# Patient Record
Sex: Female | Born: 1940 | Race: White | Hispanic: No | Marital: Single | Smoking: Never smoker
Health system: Southern US, Community
[De-identification: ages and names within clinical notes are randomized; demographics above are authoritative.]

## PROBLEM LIST (undated history)

## (undated) DIAGNOSIS — I629 Nontraumatic intracranial hemorrhage, unspecified: Secondary | ICD-10-CM

## (undated) DIAGNOSIS — B019 Varicella without complication: Secondary | ICD-10-CM

## (undated) DIAGNOSIS — H9190 Unspecified hearing loss, unspecified ear: Secondary | ICD-10-CM

## (undated) DIAGNOSIS — I10 Essential (primary) hypertension: Secondary | ICD-10-CM

## (undated) DIAGNOSIS — J45909 Unspecified asthma, uncomplicated: Secondary | ICD-10-CM

## (undated) DIAGNOSIS — G8929 Other chronic pain: Secondary | ICD-10-CM

## (undated) DIAGNOSIS — N390 Urinary tract infection, site not specified: Secondary | ICD-10-CM

## (undated) DIAGNOSIS — Z87828 Personal history of other (healed) physical injury and trauma: Secondary | ICD-10-CM

## (undated) DIAGNOSIS — I2510 Atherosclerotic heart disease of native coronary artery without angina pectoris: Secondary | ICD-10-CM

## (undated) DIAGNOSIS — H409 Unspecified glaucoma: Secondary | ICD-10-CM

## (undated) DIAGNOSIS — R32 Unspecified urinary incontinence: Secondary | ICD-10-CM

## (undated) DIAGNOSIS — K635 Polyp of colon: Secondary | ICD-10-CM

## (undated) DIAGNOSIS — E039 Hypothyroidism, unspecified: Secondary | ICD-10-CM

## (undated) DIAGNOSIS — I252 Old myocardial infarction: Secondary | ICD-10-CM

## (undated) DIAGNOSIS — E785 Hyperlipidemia, unspecified: Secondary | ICD-10-CM

## (undated) DIAGNOSIS — T07XXXA Unspecified multiple injuries, initial encounter: Secondary | ICD-10-CM

## (undated) HISTORY — PX: CORNEAL TRANSPLANT: SHX108

## (undated) HISTORY — DX: Other chronic pain: G89.29

## (undated) HISTORY — DX: Hyperlipidemia, unspecified: E78.5

## (undated) HISTORY — DX: Unspecified glaucoma: H40.9

## (undated) HISTORY — DX: Unspecified multiple injuries, initial encounter: T07.XXXA

## (undated) HISTORY — DX: Polyp of colon: K63.5

## (undated) HISTORY — PX: TONSILLECTOMY: SUR1361

## (undated) HISTORY — PX: BLADDER REPAIR: SHX76

## (undated) HISTORY — DX: Nontraumatic intracranial hemorrhage, unspecified: I62.9

## (undated) HISTORY — DX: Essential (primary) hypertension: I10

## (undated) HISTORY — DX: Unspecified hearing loss, unspecified ear: H91.90

## (undated) HISTORY — DX: Urinary tract infection, site not specified: N39.0

## (undated) HISTORY — PX: RIGHT AND LEFT HEART CATH: CATH118262

## (undated) HISTORY — DX: Unspecified asthma, uncomplicated: J45.909

## (undated) HISTORY — PX: CRANIOTOMY: SHX93

## (undated) HISTORY — DX: Varicella without complication: B01.9

## (undated) HISTORY — PX: ABDOMINAL HYSTERECTOMY: SHX81

## (undated) HISTORY — DX: Hypothyroidism, unspecified: E03.9

## (undated) HISTORY — PX: OTHER SURGICAL HISTORY: SHX169

## (undated) HISTORY — DX: Unspecified urinary incontinence: R32

---
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
## 2015-11-25 HISTORY — PX: TRANSTHORACIC ECHOCARDIOGRAM: SHX275

## 2016-03-06 DIAGNOSIS — H18519 Endothelial corneal dystrophy, unspecified eye: Secondary | ICD-10-CM | POA: Insufficient documentation

## 2016-03-26 DIAGNOSIS — Z87828 Personal history of other (healed) physical injury and trauma: Secondary | ICD-10-CM

## 2016-03-26 HISTORY — DX: Personal history of other (healed) physical injury and trauma: Z87.828

## 2016-07-24 DIAGNOSIS — I2510 Atherosclerotic heart disease of native coronary artery without angina pectoris: Secondary | ICD-10-CM | POA: Insufficient documentation

## 2016-07-24 HISTORY — DX: Atherosclerotic heart disease of native coronary artery without angina pectoris: I25.10

## 2016-07-24 HISTORY — PX: RIGHT AND LEFT HEART CATH: CATH118262

## 2016-07-31 DIAGNOSIS — I2510 Atherosclerotic heart disease of native coronary artery without angina pectoris: Secondary | ICD-10-CM | POA: Insufficient documentation

## 2016-07-31 DIAGNOSIS — I252 Old myocardial infarction: Secondary | ICD-10-CM

## 2016-07-31 HISTORY — DX: Atherosclerotic heart disease of native coronary artery without angina pectoris: I25.10

## 2016-07-31 HISTORY — DX: Old myocardial infarction: I25.2

## 2017-09-19 ENCOUNTER — Other Ambulatory Visit: Payer: Self-pay

## 2017-09-19 ENCOUNTER — Ambulatory Visit
Admission: EM | Admit: 2017-09-19 | Discharge: 2017-09-19 | Disposition: A | Discharge status: Home / Self Care | Payer: Medicare Other | Attending: Family Medicine | Admitting: Family Medicine

## 2017-09-19 ENCOUNTER — Ambulatory Visit
Admit: 2017-09-19 | Discharge: 2017-09-19 | Disposition: A | Discharge status: Home / Self Care | Payer: Medicare Other | Attending: Family Medicine | Admitting: Family Medicine

## 2017-09-19 ENCOUNTER — Ambulatory Visit: Payer: Medicare Other

## 2017-09-19 DIAGNOSIS — M7989 Other specified soft tissue disorders: Secondary | ICD-10-CM | POA: Diagnosis not present

## 2017-09-19 DIAGNOSIS — M25562 Pain in left knee: Secondary | ICD-10-CM | POA: Diagnosis present

## 2017-09-19 DIAGNOSIS — I10 Essential (primary) hypertension: Secondary | ICD-10-CM | POA: Insufficient documentation

## 2017-09-19 DIAGNOSIS — Z7982 Long term (current) use of aspirin: Secondary | ICD-10-CM | POA: Diagnosis not present

## 2017-09-19 DIAGNOSIS — M7122 Synovial cyst of popliteal space [Baker], left knee: Secondary | ICD-10-CM | POA: Diagnosis not present

## 2017-09-19 DIAGNOSIS — Z79899 Other long term (current) drug therapy: Secondary | ICD-10-CM | POA: Diagnosis not present

## 2017-09-19 DIAGNOSIS — Z79890 Hormone replacement therapy (postmenopausal): Secondary | ICD-10-CM | POA: Insufficient documentation

## 2017-09-19 DIAGNOSIS — Z823 Family history of stroke: Secondary | ICD-10-CM | POA: Diagnosis not present

## 2017-09-19 DIAGNOSIS — E785 Hyperlipidemia, unspecified: Secondary | ICD-10-CM | POA: Insufficient documentation

## 2017-09-19 MED ORDER — TRAMADOL HCL 50 MG PO TABS: 50.0000 mg | ORAL_TABLET | Freq: Three times a day (TID) | ORAL | 0 refills | Status: DC | PRN

## 2017-09-19 NOTE — Discharge Instructions (Signed)
Medication as directed.  We will call with the [place] results.  Take care  Dr. Lacinda Axon

## 2017-09-19 NOTE — ED Provider Notes (Signed)
[facility]  CSN: [id number] Arrival date & time: 09/19/17  1501  History   Chief Complaint Chief Complaint  Patient presents with  . Knee Pain    left   HPI  77 year old female presents with left knee pain as well as left lower extremity swelling.  Patient reports a one-month history of left knee pain.  She states that it worsened after starting a new exercise routine.  Worse with activity.  Moderate in severity.  No relieving factors.  Additionally, patient reports ankle and calf swelling as of yesterday.  She just traveled to [facility] via train on a 13 to 14-hour ride.  Is concerned for DVT.  No other associated symptoms.  No other claims concerns this time.  PMH - HTN, HLD  Past Surgical History:  Procedure Laterality Date  . ABDOMINAL HYSTERECTOMY    . BLADDER REPAIR    . CESAREAN SECTION    . RIGHT AND LEFT HEART CATH    . TONSILLECTOMY     OB History   None    Home Medications    Prior to Admission medications   Medication Sig Start Date End Date Taking? Authorizing Provider  aspirin 81 MG chewable tablet Chew by mouth daily.   Yes [provider]  Calcium Carb-Cholecalciferol (CALCIUM 1000 + D PO) Take by mouth.   Yes [provider]  carvedilol (COREG) 12.5 MG tablet Take 12.5 mg by mouth 2 (two) times daily with a meal.   Yes [provider]  ESTROGENS CONJ SYNTHETIC B PO Take by mouth.   Yes [provider]  levothyroxine (SYNTHROID, LEVOTHROID) 88 MCG tablet Take 88 mcg by mouth daily before breakfast.   Yes [provider]  losartan (COZAAR) 50 MG tablet Take 50 mg by mouth daily.   Yes [provider]  rosuvastatin (CRESTOR) 20 MG tablet Take 20 mg by mouth daily.   Yes [provider]  traMADol (ULTRAM) 50 MG tablet Take 1 tablet (50 mg total) by mouth every 8 (eight) hours as needed. 09/19/17   Coral Spikes, DO    Family History Family History  Problem Relation Age of Onset    . Cancer Father   . CVA Father     Social History Social History   Tobacco Use  . Smoking status: Never Smoker  . Smokeless tobacco: Never Used  Substance Use Topics  . Alcohol use: Never    Frequency: Never  . Drug use: Never     Allergies   Patient has no known allergies.   Review of Systems Review of Systems  Cardiovascular: Positive for leg swelling.  Musculoskeletal:       Left knee pain.   Physical Exam Triage Vital Signs ED Triage Vitals  Enc Vitals Group     BP 09/19/17 1524 (!) 161/77     Pulse Rate 09/19/17 1524 69     Resp 09/19/17 1524 18     Temp 09/19/17 1524 97.7 F (36.5 C)     Temp Source 09/19/17 1524 Oral     SpO2 09/19/17 1524 98 %     Weight 09/19/17 1520 145 lb (65.8 kg)     Height 09/19/17 1520 5' (1.524 m)     Head Circumference --      Peak Flow --      Pain Score 09/19/17 1520 7     Pain Loc --      Pain Edu? --      Excl. in  GC? --    Updated Vital Signs BP (!) 161/77 (BP Location: Left Arm)   Pulse 69   Temp 97.7 F (36.5 C) (Oral)   Resp 18   Ht 5' (1.524 m)   Wt 145 lb (65.8 kg)   SpO2 98%   BMI 28.32 kg/m   Visual Acuity Right Eye Distance:   Left Eye Distance:   Bilateral Distance:    Right Eye Near:   Left Eye Near:    Bilateral Near:     Physical Exam  Constitutional: She is oriented to person, place, and time. She appears well-developed. No distress.  HENT:  Head: Normocephalic and atraumatic.  Cardiovascular: Normal rate and regular rhythm.  Left ankle and calf with 1+ edema.  Pulmonary/Chest: Effort normal and breath sounds normal. She has no wheezes. She has no rales.  Musculoskeletal:  Left knee -no apparent effusion.  Normal range of motion.  Ligaments intact.  No discrete areas of tenderness.    Neurological: She is alert and oriented to person, place, and time.  Psychiatric: She has a normal mood and affect. Her behavior is normal.  Nursing note and vitals reviewed.  UC Treatments / Results   Labs (all labs ordered are listed, but only abnormal results are displayed) Labs Reviewed - No data to display  EKG None  Radiology Dg Knee Complete 4 Views Left  Result Date: 09/19/2017 CLINICAL DATA:  Bilateral anterior knee pain adjacent to the patella for 1 month. No trauma. EXAM: LEFT KNEE - COMPLETE 4+ VIEW COMPARISON:  None. FINDINGS: Mild seen laterally. Mild degenerative changes in the medial compartment. Enthesopathic changes at the superior patella. No significant joint effusion. No fracture noted. Increased attenuation Hoffa's fat. The patellar tendon demonstrates a mildly irregular contour. No bony lesions. No other acute abnormalities. IMPRESSION: Increased attenuation within Hoffa's fat and an indistinct anterior border of the patellar tendon could represent sequela of inflammation. Recommend clinical correlation. Enthesopathic changes at the superior patella. Electronically Signed   By: Dorise Bullion III M.D   On: 09/19/2017 16:06    Procedures Procedures (including critical care time)  Medications Ordered in UC Medications - No data to display  Initial Impression / Assessment and Plan / UC Course  I have reviewed the triage vital signs and the nursing notes.  Pertinent labs & imaging results that were available during my care of the patient were reviewed by me and considered in my medical decision making (see chart for details).    77 year old female presents with left knee pain and left leg swelling.  X-ray obtained and revealed possible inflammation around the patellar tendon.  Treating with tramadol.  Patient had a recent long train ride, concern for DVT.  Sending for ultrasound.  Final Clinical Impressions(s) / UC Diagnoses   Final diagnoses:  Acute pain of left knee  Left leg swelling     Discharge Instructions     Medication as directed.  We will call with the [place] results.  Take care  Dr. Lacinda Axon     ED Prescriptions    Medication Sig Dispense  Auth. Provider   traMADol (ULTRAM) 50 MG tablet Take 1 tablet (50 mg total) by mouth every 8 (eight) hours as needed. 15 tablet Coral Spikes, DO     Controlled Substance Prescriptions [place] Controlled Substance Registry consulted? Not Applicable   Coral Spikes, DO 09/19/17 1712

## 2017-09-19 NOTE — ED Triage Notes (Signed)
Patient complains of left knee pain that started weeks ago. Patient states that the pain worsened after a recent train trip x 14 hours. Patient states that the knee pain has been worsening and now has calf and ankle swelling.

## 2018-11-10 ENCOUNTER — Telehealth: Payer: Self-pay

## 2018-11-10 NOTE — Telephone Encounter (Signed)
NOTES ON FILE FROM DR Karena Addison [phone], SENT REFERRAL TO [place]

## 2018-12-31 ENCOUNTER — Encounter: Payer: Self-pay | Admitting: Cardiology

## 2018-12-31 ENCOUNTER — Other Ambulatory Visit: Payer: Self-pay

## 2018-12-31 ENCOUNTER — Ambulatory Visit (INDEPENDENT_AMBULATORY_CARE_PROVIDER_SITE_OTHER): Payer: Medicare Other | Admitting: Cardiology

## 2018-12-31 VITALS — BP 162/80 | HR 67 | Ht 60.0 in | Wt 145.0 lb

## 2018-12-31 DIAGNOSIS — E785 Hyperlipidemia, unspecified: Secondary | ICD-10-CM | POA: Diagnosis not present

## 2018-12-31 DIAGNOSIS — I10 Essential (primary) hypertension: Secondary | ICD-10-CM | POA: Diagnosis not present

## 2018-12-31 DIAGNOSIS — I2510 Atherosclerotic heart disease of native coronary artery without angina pectoris: Secondary | ICD-10-CM | POA: Diagnosis not present

## 2018-12-31 MED ORDER — LOSARTAN POTASSIUM 50 MG PO TABS: 50.0000 mg | ORAL_TABLET | Freq: Two times a day (BID) | ORAL | 3 refills | Status: DC

## 2018-12-31 MED ORDER — CARVEDILOL 12.5 MG PO TABS: 12.5000 mg | ORAL_TABLET | Freq: Two times a day (BID) | ORAL | 3 refills | Status: DC

## 2018-12-31 MED ORDER — ROSUVASTATIN CALCIUM 20 MG PO TABS: 20.0000 mg | ORAL_TABLET | Freq: Every day | ORAL | 3 refills | Status: DC

## 2018-12-31 NOTE — Progress Notes (Signed)
PCP: McLean-Scocuzza, [name], MD --> first appointment in late October  Was followed by [place], [address], [facility] --Last available note was from Dr. Orlene Och who has apparently retired --[facility] has been [facility] [place] PCP was Dr. Abigail Miyamoto [facility], [place], [place] [facility] Note: Chief Complaint  Patient presents with  . New Patient (Initial Visit)    Establish new cardiologist in [place]  . Coronary Artery Disease    History of MI with medical management    HPI: Amy Montes is a 78 y.o. female with a PMH below who presents today to establish local cardiologist after recently moving to [place], [place] from [place], [place].  She is actually referred by a patient of mine by the name of Amy Montes.  She has had to see her new PCP in [place].  The last cardiology note I have on Amy Montes was from Dr. Rollene Fare February 2019 (however apparently she did see another cardiologist that she says was Dr. Tania Ade (of the newly established [facility]). ->  This note indicated that she was doing well.  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  She was having no further issues after her inferior STEMI.  A murmur was listed, but no documentation of an echo besides saying echo September 2017 within normal limits.  Recent Hospitalizations:   May 2018 -> admitted with inferior STEMI (chest pain radiating to the jaw-associated with general sense of unease and shortness of breath/fatigue).  Cardiac cath revealed hazy appearing ruptured plaque and a small caliber RPDA felt to be too small for PTCA.  LV gram showed EF roughly 50% with inferior hypokinesis and mildly elevated EDP of 26 mmHg.  She was treated medically and did well leading to discharge.  Studies Personally Reviewed - (if available, images/films reviewed: From Epic  Chart or Care Everywhere)  Cardiac Cath May 2018: Normal LM, minimal roughly 20% pLAD, relatively normal LCx with 20% LPL.  Mild calcified 30% ostial RCA but otherwise normal remainder the RCA to the bifurcation.  RPAV-PL relatively normal. ~1.5 mm RPDA had hazy appearing ruptured plaque.  EF 50%.  Inferior HK.  EDP 26 mmHg.  Per clinic note: Echocardiogram September 2017-WNL  Interval History: Amy Montes is finally now presenting here to reestablish cardiology care (she apparently was seen either late last year early this year by her most recent cardiologist and had blood work done (not available).  She moved up to [place] to be close to her son and daughter who live in [place].  She finally moved into her house in June after moving up in May, and has been getting established in her new environment.  She has not yet learned the neighborhood did not feel comfortable going out into the neighborhood walking, but she does meal around the house doing yard work (leaving the heavy gardening work to her son).  Unfortunately with COVID-19 restrictions, she has not been able to establish a place to do her usual exercise routine which involves water aerobics and water walking.  Most of the pools of been closed.  She really has been pretty much asymptomatic since her MI.  I do not think she fully understood really what happened when she went to the hospital in 2018.  She is not had any further symptoms like that chest discomfort radiating to the jaw with fatigue and nausea etc.  She does  not do a lot of exercise now and therefore is probably little deconditioned so she does get some exertional dyspnea.  She is also probably put on some weight with lack of exercise.  No exertional chest pain or pressure. No resting chest pain or dyspnea.  Which she says, while sleeping every few months she will have a spell will she will feel kind of short of breath and have sort of strange sensation in her chest that lasts only for a few  seconds to maybe a minute and then goes away.  This oftentimes happens when she is sleeping.  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  Otherwise she really has not had true PND or orthopnea.  No palpitations, lightheadedness, dizziness, weakness or syncope/near syncope. No TIA/amaurosis fugax symptoms. No melena, hematochezia, hematuria, or epstaxis. No claudication.  ROS: A comprehensive was performed. Review of Systems  Constitutional: Negative for malaise/fatigue and weight loss.  HENT: Negative for congestion and nosebleeds.   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  As a result of the medications that she was on she has some left eye ptosis with some spatial recognition and depth perception issues  Respiratory: Negative for shortness of breath (Sometimes now with exertion she gets a little bit winded).   Cardiovascular: Negative for leg swelling.  Gastrointestinal: Negative for abdominal pain, constipation, heartburn, nausea and vomiting.  Genitourinary: Negative for dysuria and hematuria.  Musculoskeletal: Positive for back pain (She has chronic back pain and right shoulder pain that radiates the neck) and neck pain.  Neurological: Positive for dizziness (Sometimes when she stands up too quickly). Negative for weakness.  Psychiatric/Behavioral: Negative for memory loss. The patient is not nervous/anxious and does not have insomnia.   All other systems reviewed and are negative.  The patient does not have symptoms concerning for COVID-19 infection (fever, chills, cough, or new shortness of breath).  The patient is practicing social distancing.   COVID-19 Education: The signs and symptoms of COVID-19 were discussed with the patient and how to seek care for testing  (follow up with PCP or arrange E-visit).   The importance of social distancing was discussed today.   I have reviewed and (if needed) personally updated the patient's problem list, medications, allergies, past medical and surgical history, social and family history.   Past Medical History:  Diagnosis Date  . Chronic back pain    Said spontaneous thoracic and lumbar spine fractures secondary to falls.  . Coronary artery disease involving native coronary artery without angina pectoris 07/2016   In setting of inferior STEMI, cardiac cath showed occluded RCA.  Too small for PCI  . Essential hypertension   . Glaucoma   . 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  Injured her Montes. ->  Both aspirin and Plavix were stopped. ->  No residual findings  . Hearing loss   . 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  Marland Kitchen Hyperlipidemia with target LDL less than 70    On rosuvastatin  . Hypothyroidism (acquired)    On Synthroid    Past Surgical History:  Procedure Laterality Date  . ABDOMINAL HYSTERECTOMY    .  BLADDER REPAIR    . CESAREAN SECTION    . CORNEAL TRANSPLANT Left    Apparently was a failed attempt.  Kyla Balzarine     SAH evaccuation  . RIGHT AND LEFT HEART CATH  07/2016   Inferior STEMI.  Culprit lesion mRPDA = was too small to address.  20% pLAD.  LCx-LPL normal.  Patent.  RCA ostial 30% RCA dominant.  Hazy plaque rupture and a small 1.5 mm PDA. --> LV Gram EF 50%, Inf HK. Increased LVEDP  . TONSILLECTOMY    . TRANSTHORACIC ECHOCARDIOGRAM  11/2015   Right is being within normal limits.    Current Meds  Medication Sig  . aspirin 81 MG chewable tablet Chew by mouth daily.  . Calcium Carb-Cholecalciferol (CALCIUM 1000 + D PO) Take by mouth.  . carvedilol (COREG) 12.5 MG tablet Take 1 tablet (12.5 mg total) by mouth 2 (two) times daily with a meal.  . ESTROGENS CONJ SYNTHETIC B PO  Take by mouth.  . levothyroxine (SYNTHROID, LEVOTHROID) 88 MCG tablet Take 88 mcg by mouth daily before breakfast.  . losartan (COZAAR) 50 MG tablet Take 1 tablet (50 mg total) by mouth 2 (two) times daily.  . rosuvastatin (CRESTOR) 20 MG tablet Take 1 tablet (20 mg total) by mouth daily.  . traMADol (ULTRAM) 50 MG tablet Take 1 tablet (50 mg total) by mouth every 8 (eight) hours as needed.  . [DISCONTINUED] carvedilol (COREG) 12.5 MG tablet Take 12.5 mg by mouth 2 (two) times daily with a meal.  . [DISCONTINUED] losartan (COZAAR) 50 MG tablet Take 50 mg by mouth 2 (two) times daily.   . [DISCONTINUED] rosuvastatin (CRESTOR) 20 MG tablet Take 20 mg by mouth daily.    No Known Allergies  Social History   Tobacco Use  . Smoking status: Never Smoker  . Smokeless tobacco: Never Used  Substance Use Topics  . Alcohol use: Never    Frequency: Never  . Drug use: Never   Social History   Social History Narrative   Recently moved from [place], [place].->  Now lives in [place], [place]   Lives alone, but very close to her son Fara Olden and his wife.   Her daughter lives in [place], [place]   She has 3 grandchildren.      She drinks coffee.     She does water aerobics/pool exercises.      Formally worked as a Surveyor, minerals.  Prior to moving to [place], she was the primary caregiver for her brother who passed away in 2017/07/10.  Once he died, her children convinced her to move up to [place] to be near them.  Recently moved from [place]  family history includes CVA in her father; Cancer in her father; Colon cancer in her brother; Dementia in her sister; Heart disease in her maternal grandmother; Lung cancer in her brother; Lupus in her sister; Other in her mother.  Wt Readings from Last 3 Encounters:  12/31/18 145 lb (65.8 kg)  09/19/17 145 lb (65.8 kg)    PHYSICAL EXAM BP (!) 162/80   Pulse 67   Ht 5' (1.524 m)   Wt 145 lb (65.8 kg)   SpO2 95%   BMI 28.32 kg/m --, She tells me that her blood pressures at home  range from 120s/60s to the 140s/80s.  Rarely to the get the level they are here today. Physical Exam  Constitutional: She is oriented to person, place, and time. She appears well-developed and well-nourished. No distress.  Healthy-appearing  elderly woman.  Well-groomed  HENT:  Head: Normocephalic and atraumatic.  Eyes: Left eye exhibits no discharge.  She does have some mild violaceous discoloration of the left eye with some ptosis and lack of tracking with the left eye extraocular muscles.  Neck: Normal range of motion. Neck supple. No hepatojugular reflux and no JVD present. Carotid bruit is not present.  Cardiovascular: Normal rate, regular rhythm, S1 normal, S2 normal, normal heart sounds, intact distal pulses and normal pulses.  Occasional extrasystoles are present. PMI is not displaced. Exam reveals no gallop and no friction rub.  No murmur heard. Pulmonary/Chest: Effort normal and breath sounds normal. No respiratory distress. She has no wheezes. She has no rales. She exhibits no tenderness.  Abdominal: Soft. Bowel sounds are normal. She exhibits no distension. There is no abdominal tenderness. There is no rebound.  Musculoskeletal: Normal range of motion.        General: Deformity (Significant thoracolumbar kyphosis with mild scoliosis) and edema (Trivial pedal edema) present.  Neurological: She is alert and oriented to person, place, and time. No cranial nerve deficit (Left eye ptosis and slow tracking.).  Skin: Skin is warm and dry.  Psychiatric: She has a normal mood and affect. Her behavior is normal. Judgment and thought content normal.  Vitals reviewed.   Adult ECG Report  Rate: 67 ;  Rhythm: normal sinus rhythm and Normal axis, intervals and durations.;   Narrative Interpretation: Normal EKG   Other studies Reviewed: Additional studies/ records that were reviewed today include:  Recent Labs: Labs not available, should be due soon.  May likely be drawn by her new PCP later  this month  ASSESSMENT / PLAN: Problem List Items Addressed This Visit    Coronary artery disease involving native heart without angina pectoris - Primary (Chronic)    Really mild disease in the major vessels with the plaque rupture and a small PDA that was treated medically.  Suspect that it recanalized with medical management.  She has not had any angina.  I do not think there was a follow-up echocardiogram done after hospitalization.  Last echocardiogram reported that I see was in 2017 which was pre-MI.  She had relatively preserved EF by LV gram and has not had any further echo or angina symptoms.  He is on a stable regimen with carvedilol and losartan along with rosuvastatin.  She has been off of Plavix since her fall complicated by subarachnoid hemorrhage.  She is only on aspirin.  Since she is doing well and stable, we will continue current meds for now.  Would probably want to get a baseline echocardiogram in the next year or 2 -> any studies ordered can be done at [facility] since she lives closer to [place] then [place]. Would also like to have her establish with a APP in the [place] office as a backup.      Relevant Medications   rosuvastatin (CRESTOR) 20 MG tablet   losartan (COZAAR) 50 MG tablet   carvedilol (COREG) 12.5 MG tablet   Hyperlipidemia with target LDL less than 70 (Chronic)    Currently on rosuvastatin.  She should be due to have lipids done.  If not checked by her PCP as of her October visit, we can order lipid panel and CMP at a minimum the can be drawn in the [place] office or at [facility].      Relevant Medications   rosuvastatin (CRESTOR) 20 MG tablet   losartan (COZAAR) 50 MG tablet   carvedilol (COREG)  12.5 MG tablet   Other Relevant Orders   EKG 12-Lead   Lipid panel   Comprehensive metabolic panel   Essential hypertension (Chronic)    Blood pressures little bit high today, but she does seem a little anxious.  I think we can wait to see what her  pressures look like in the future.  She is on a pretty hefty dose of carvedilol and losartan for her age.  Would be reluctant to titrate doses up since she says at home her pressures are usually much better than this.      Relevant Medications   rosuvastatin (CRESTOR) 20 MG tablet   losartan (COZAAR) 50 MG tablet   carvedilol (COREG) 12.5 MG tablet   Other Relevant Orders   EKG 12-Lead   Lipid panel   Comprehensive metabolic panel     I spent a total of 40-45 minutes with the patient and chart review -> reviewed most recent clinic note and updated PMH/PSH. >  50% of the time was spent in direct patient consultation.   Current medicines are reviewed at length with the patient today.  (+/- concerns) none The following changes have been made:  None  Patient Instructions  Medication Instructions:   MEDICATION RENEWED NO CHANGES If you need a refill on your cardiac medications before your next appointment, please call your pharmacy.    Lab work: NOV 2021-MAY [facility] IN [place] CMP LIPID= FASTING - NOTHING TO EAT OR DRINK EXCEPT MEDICATIONS  If you have labs (blood work) drawn today and your tests are completely normal, you will receive your results only by: Marland Kitchen MyChart Message (if you have MyChart) OR . A paper copy in the mail If you have any lab test that is abnormal or we need to change your treatment, we will call you to review the results.  Testing/Procedures: NOT NEEDED  Follow-Up: At [facility], you and your health needs are our priority.  As part of our continuing mission to provide you with exceptional heart care, we have created designated Provider Care Teams.  These Care Teams include your primary Cardiologist (physician) and Advanced Practice Providers (APPs -  Physician Assistants and Nurse Practitioners) who all work together to provide you with the care you need, when you need it. . You will need a follow up appointment in   6 months-April 2021.  Please  call our office 2 months in advance to schedule this appointment.  You may see Glenetta Hew, MD or one of the following Advanced Practice Providers on your designated Care Team:   . Rosaria Ferries, PA-C . Jory Sims, DNP, ANP  Any Other Special Instructions Will Be Listed Below (If Applicable).  After next visit, we will likely arrange 58-month follow-up with an APP in the [place] office.  Studies Ordered:   Orders Placed This Encounter  Procedures  . Lipid panel  . Comprehensive metabolic panel  . EKG 12-Lead      Glenetta Hew, M.D., M.S. Interventional Cardiologist   Pager # [phone] Phone # [phone] [address] [place], [place] [address]   Thank you for choosing Heartcare at [facility]!!

## 2018-12-31 NOTE — Patient Instructions (Addendum)
Medication Instructions:   MEDICATION RENEWED NO CHANGES If you need a refill on your cardiac medications before your next appointment, please call your pharmacy.    Lab work: NOV 2021-MAY [facility] [place] CMP LIPID= FASTING - NOTHING TO EAT OR DRINK EXCEPT MEDICATIONS  If you have labs (blood work) drawn today and your tests are completely normal, you will receive your results only by: Marland Kitchen MyChart Message (if you have MyChart) OR . A paper copy in the mail If you have any lab test that is abnormal or we need to change your treatment, we will call you to review the results.  Testing/Procedures: NOT NEEDED  Follow-Up: At [facility], you and your health needs are our priority.  As part of our continuing mission to provide you with exceptional heart care, we have created designated Provider Care Teams.  These Care Teams include your primary Cardiologist (physician) and Advanced Practice Providers (APPs -  Physician Assistants and Nurse Practitioners) who all work together to provide you with the care you need, when you need it. . You will need a follow up appointment in   6 months-April 2021.  Please call our office 2 months in advance to schedule this appointment.  You may see Glenetta Hew, MD or one of the following Advanced Practice Providers on your designated Care Team:   . Rosaria Ferries, PA-C . Jory Sims, DNP, ANP  Any Other Special Instructions Will Be Listed Below (If Applicable).

## 2019-01-01 ENCOUNTER — Encounter: Payer: Self-pay | Admitting: Cardiology

## 2019-01-01 ENCOUNTER — Other Ambulatory Visit: Payer: Self-pay

## 2019-01-01 NOTE — Assessment & Plan Note (Signed)
Blood pressures little bit high today, but she does seem a little anxious.  I think we can wait to see what her pressures look like in the future.  She is on a pretty hefty dose of carvedilol and losartan for her age.  Would be reluctant to titrate doses up since she says at home her pressures are usually much better than this.

## 2019-01-01 NOTE — Assessment & Plan Note (Addendum)
Currently on rosuvastatin.  She should be due to have lipids done.  If not checked by her PCP as of her October visit, we can order lipid panel and CMP at a minimum the can be drawn in the [place] office or at [place].

## 2019-01-01 NOTE — Assessment & Plan Note (Addendum)
Really mild disease in the major vessels with the plaque rupture and a small PDA that was treated medically.  Suspect that it recanalized with medical management.  She has not had any angina.  I do not think there was a follow-up echocardiogram done after hospitalization.  Last echocardiogram reported that I see was in 2017 which was pre-MI.  She had relatively preserved EF by LV gram and has not had any further echo or angina symptoms.  He is on a stable regimen with carvedilol and losartan along with rosuvastatin.  She has been off of Plavix since her fall complicated by subarachnoid hemorrhage.  She is only on aspirin.  Since she is doing well and stable, we will continue current meds for now.  Would probably want to get a baseline echocardiogram in the next year or 2 -> any studies ordered can be done at [facility] since she lives closer to [place] then [place]. Would also like to have her establish with a APP in the [place] office as a backup.

## 2019-01-06 ENCOUNTER — Encounter: Payer: Self-pay | Admitting: Internal Medicine

## 2019-01-06 ENCOUNTER — Other Ambulatory Visit: Payer: Self-pay

## 2019-01-06 ENCOUNTER — Ambulatory Visit (INDEPENDENT_AMBULATORY_CARE_PROVIDER_SITE_OTHER): Payer: Medicare Other | Admitting: Internal Medicine

## 2019-01-06 VITALS — BP 166/86 | HR 84 | Temp 97.9°F | Ht 60.0 in | Wt 142.1 lb

## 2019-01-06 DIAGNOSIS — N3000 Acute cystitis without hematuria: Secondary | ICD-10-CM

## 2019-01-06 DIAGNOSIS — I2510 Atherosclerotic heart disease of native coronary artery without angina pectoris: Secondary | ICD-10-CM

## 2019-01-06 DIAGNOSIS — I10 Essential (primary) hypertension: Secondary | ICD-10-CM

## 2019-01-06 DIAGNOSIS — E785 Hyperlipidemia, unspecified: Secondary | ICD-10-CM | POA: Diagnosis not present

## 2019-01-06 DIAGNOSIS — I252 Old myocardial infarction: Secondary | ICD-10-CM

## 2019-01-06 DIAGNOSIS — R1011 Right upper quadrant pain: Secondary | ICD-10-CM

## 2019-01-06 DIAGNOSIS — E039 Hypothyroidism, unspecified: Secondary | ICD-10-CM

## 2019-01-06 DIAGNOSIS — M7122 Synovial cyst of popliteal space [Baker], left knee: Secondary | ICD-10-CM

## 2019-01-06 DIAGNOSIS — E559 Vitamin D deficiency, unspecified: Secondary | ICD-10-CM

## 2019-01-06 DIAGNOSIS — Z1231 Encounter for screening mammogram for malignant neoplasm of breast: Secondary | ICD-10-CM

## 2019-01-06 NOTE — Progress Notes (Signed)
Chief Complaint  Patient presents with  . Establish Care   New patient  1. Hypothyroidism on levo 88 mg qd  2. H/o multiple falls with vertebra fractures x 5 in the past no recent falls but multiple in the past she was on plavix but had ICH vs SDH and was taking off  3. HTN BP elevated today on losartan 50 mg bid, coreg 12.5 mg bid 4 h/o CAD w/o PCI on crestor 20 mg qhs f/u Dr. Ellyn Hack  5. C/o RUQ ab pain chronic x 1-2 years mild to moderate worse with food nothing tried pain is intermittent 6. Bakers cyst left knee 3.1 x 1.2 x 3.6 denies knee pain or swelling noted on [place] 08/2017  7. 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 Brigitte Pulse in [place]   Review of Systems  Constitutional: Negative for weight loss.  HENT: Negative for hearing loss.   Eyes: Negative for blurred vision.  Respiratory: Negative for shortness of breath.   Cardiovascular: Negative for chest pain.  Gastrointestinal: Positive for abdominal pain.  Musculoskeletal: Positive for falls.  Skin: Negative for rash.  Neurological: Negative for headaches.  Psychiatric/Behavioral: Negative for depression and memory loss.   Past Medical History:  Diagnosis Date  . Asthma    childhood  . Chicken pox   . Chronic back pain    Said spontaneous thoracic and lumbar spine fractures secondary to falls.  . Colon polyps   . Coronary artery disease involving native coronary artery without angina pectoris 07/2016   In setting of inferior STEMI, cardiac cath showed occluded RCA.  Too small for PCI  . Essential hypertension   . Glaucoma   . 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  Injured her face. ->  Both aspirin and Plavix were stopped. ->  No residual findings  . Hearing loss   . 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 (no stents)   . Hyperlipidemia with target LDL less than 70    On  rosuvastatin  . Hypothyroidism (acquired)    On Synthroid  . Intracranial hemorrhage ([place])    s/p fall years ago when on ? plavix so this was stopped   . Multiple fracture    x 5 vertebrae   . Urinary incontinence   . UTI (urinary tract infection)    Past Surgical History:  Procedure Laterality Date  . ABDOMINAL HYSTERECTOMY     2/2 endometriosis and DUB  . BLADDER REPAIR    . cataract surgery     b/l  . CESAREAN SECTION    . 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 Sabra Heck in [facility]  . CRANIOTOMY     SAH evaccuation  . RIGHT AND LEFT HEART CATH  07/2016   Inferior STEMI.  Culprit lesion mRPDA = was too small to address.  20% pLAD.  LCx-LPL normal.  Patent.  RCA ostial 30% RCA dominant.  Hazy plaque rupture and a small 1.5 mm PDA. --> LV Gram EF 50%, Inf HK. Increased LVEDP  . TONSILLECTOMY    . TRANSTHORACIC ECHOCARDIOGRAM  11/2015   Right is being within normal limits.   Family History  Problem Relation Age of Onset  . Other Mother        Old age  . Hearing loss Mother   . Cancer Father  colon died in Jul 13, 1959  . CVA Father   . Lupus Sister   . Lung cancer Brother   . Cancer Brother        lung smoker  . Heart disease Maternal Grandmother   . Dementia Sister   . Colon cancer Brother   . Cancer Brother        colon  . Cancer Son        testicular cancer dx age 72 now 89 as of 12/2018    Social History   Socioeconomic History  . Marital status: Single    Spouse name: Not on file  . Number of children: 2  . Years of education: Not on file  . Highest education level: High school graduate  Occupational History  . Occupation: Nanny  Social Needs  . Financial resource strain: Not on file  . Food insecurity    Worry: Not on file    Inability: Not on file  . Transportation needs    Medical: Not on file    Non-medical: Not on file  Tobacco Use  . Smoking status: Never Smoker  . Smokeless tobacco: Never Used  Substance and Sexual  Activity  . Alcohol use: Never    Frequency: Never  . Drug use: Never  . Sexual activity: Not Currently  Lifestyle  . Physical activity    Days per week: Not on file    Minutes per session: Not on file  . Stress: Not on file  Relationships  . Social Herbalist on phone: Not on file    Gets together: Not on file    Attends religious service: Not on file    Active member of club or organization: Not on file    Attends meetings of clubs or organizations: Not on file    Relationship status: Widowed  . Intimate partner violence    Fear of current or ex partner: Not on file    Emotionally abused: Not on file    Physically abused: Not on file    Forced sexual activity: Not on file  Other Topics Concern  . Not on file  Social History Narrative   Recently moved from [place], [place].->  Now lives in [place], [place]   Lives alone, but very close to her son Fara Olden and his wife.   Her daughter lives in [place], [place]   She has 3 grandchildren.   widow      She drinks coffee.     She does water aerobics/pool exercises.      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  Prior to moving to [place], she was the primary caregiver for her brother who passed away in 12-Jul-2017.  Once he died, her children convinced her to move up to [place] to be near them.      6 other siblings 2 1/2 siblings but those 1/2 siblings died in young age   Current Meds  Medication Sig  . aspirin 81 MG chewable tablet Chew by mouth daily.  . Calcium Carb-Cholecalciferol (CALCIUM 1000 + D PO) Take by mouth 2 (two) times daily.   . carvedilol (COREG) 12.5 MG tablet Take 1 tablet (12.5 mg total) by mouth 2 (two) times daily with a meal.  . ESTROGENS CONJ SYNTHETIC B PO Take by mouth.  . levothyroxine (SYNTHROID, LEVOTHROID) 88 MCG tablet Take 88 mcg by mouth daily before breakfast.  . losartan (COZAAR) 50 MG tablet Take 1 tablet (50 mg total) by mouth 2 (  two) times daily.  Marland Kitchen LOTEMAX 0.5 % ophthalmic suspension 2 (two) times  daily.   . Multiple Vitamins-Minerals (MULTIVITAMIN ADULTS PO) Take by mouth.  . rosuvastatin (CRESTOR) 20 MG tablet Take 1 tablet (20 mg total) by mouth daily.  Marland Kitchen VITAMIN D PO Take 4,000 Int'l Units by mouth daily.  . [DISCONTINUED] traMADol (ULTRAM) 50 MG tablet Take 1 tablet (50 mg total) by mouth every 8 (eight) hours as needed.   Allergies  Allergen Reactions  . Chocolate     H/a    No results found for this or any previous visit (from the past 2160 hour(s)). Objective  Body mass index is 27.76 kg/m. Wt Readings from Last 3 Encounters:  01/06/19 142 lb 1.9 oz (64.5 kg)  12/31/18 145 lb (65.8 kg)  09/19/17 145 lb (65.8 kg)   Temp Readings from Last 3 Encounters:  01/06/19 97.9 F (36.6 C) (Oral)  09/19/17 97.7 F (36.5 C) (Oral)   BP Readings from Last 3 Encounters:  01/06/19 (!) 166/86  12/31/18 (!) 162/80  09/19/17 (!) 161/77   Pulse Readings from Last 3 Encounters:  01/06/19 84  12/31/18 67  09/19/17 69    Physical Exam Vitals signs and nursing note reviewed.  Constitutional:      Appearance: Normal appearance. She is well-developed and well-groomed.     Comments: +mask on    HENT:     Head: Normocephalic and atraumatic.  Eyes:     Conjunctiva/sclera: Conjunctivae normal.     Pupils: Pupils are equal, round, and reactive to light.  Cardiovascular:     Rate and Rhythm: Normal rate and regular rhythm.     Heart sounds: Normal heart sounds. No murmur.  Pulmonary:     Effort: Pulmonary effort is normal.     Breath sounds: Normal breath sounds.  Abdominal:     General: Abdomen is flat. Bowel sounds are normal.     Tenderness: There is abdominal tenderness.     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  Neurological:     General: No focal deficit present.     Mental Status: She is alert and oriented to person, place, and time. Mental status is at baseline.     Gait: Gait normal.   Psychiatric:        Attention and Perception: Attention and perception normal.        Mood and Affect: Mood and affect normal.        Speech: Speech normal.        Behavior: Behavior normal. Behavior is cooperative.        Thought Content: Thought content normal.        Cognition and Memory: Cognition and memory normal.        Judgment: Judgment normal.     Assessment  Plan  Essential hypertension - Plan: Comprehensive metabolic panel, CBC with Differential/Platelet, Lipid panel -monitor Bp not at goal <130/<80  Coronary artery disease involving native coronary artery of native heart without angina pectoris -cont statin and f/u cards -fasting labs   Hyperlipidemia, unspecified hyperlipidemia type - Plan: Lipid panel  Hypothyroidism, unspecified type - Plan: TSH on levo 88 mcg qd   Acute cystitis without hematuria - Plan: Urinalysis, Routine w reflex microscopic, Urine Culture RUQ abdominal pain - Plan: US Abdomen Complete  Baker's cyst, left w/o sx's   HM Declines flu shot and  other vaccines today  pna shot per pt had in 2016 ? Which one  Had zostervax at [facility] in [place] declines shingrix  Declines vaccines for today   Mammogram referred  Colonoscopy in 2015 last age 29 did not do and may like to do stool kids in future  S/p hysterectomy 1989 DUB endometrosis out of age window  DEXA in future get copy of prior Consider derm in future   Former PCP Dr. Lubertha Basque in [place] [phone] (get copy of labs, vaccines, mammogram, colonoscopy, DEXA, imaging) release faxed   Cards Dr. Glenetta Hew in [place]  Dr. Brigitte Pulse eye in [place] in [facility] [phone] Eye surgery in [facility] Suite Dr. Sabra Heck  Provider: Dr. [name] McLean-Scocuzza-Internal Medicine

## 2019-01-06 NOTE — Patient Instructions (Addendum)
Try gold bond talc free powder   Blood pressure <130/<80  Gastroesophageal Reflux Disease, Adult Gastroesophageal reflux (GER) happens when acid from the stomach flows up into the tube that connects the mouth and the stomach (esophagus). Normally, food travels down the esophagus and stays in the stomach to be digested. With GER, food and stomach acid sometimes move back up into the esophagus. You may have a disease called gastroesophageal reflux disease (GERD) if the reflux:  Happens often.  Causes frequent or very bad symptoms.  Causes problems such as damage to the esophagus. When this happens, the esophagus becomes sore and swollen (inflamed). Over time, GERD can make small holes (ulcers) in the lining of the esophagus. What are the causes? This condition is caused by a problem with the muscle between the esophagus and the stomach. When this muscle is weak or not normal, it does not close properly to keep food and acid from coming back up from the stomach. The muscle can be weak because of:  Tobacco use.  Pregnancy.  Having a certain type of hernia (hiatal hernia).  Alcohol use.  Certain foods and drinks, such as coffee, chocolate, onions, and peppermint. What increases the risk? You are more likely to develop this condition if you:  Are overweight.  Have a disease that affects your connective tissue.  Use NSAID medicines. What are the signs or symptoms? Symptoms of this condition include:  Heartburn.  Difficult or painful swallowing.  The feeling of having a lump in the throat.  A bitter taste in the mouth.  Bad breath.  Having a lot of saliva.  Having an upset or bloated stomach.  Belching.  Chest pain. Different conditions can cause chest pain. Make sure you see your doctor if you have chest pain.  Shortness of breath or noisy breathing (wheezing).  Ongoing (chronic) cough or a cough at night.  Wearing away of the surface of teeth (tooth  enamel).  Weight loss. How is this treated? Treatment will depend on how bad your symptoms are. Your doctor may suggest:  Changes to your diet.  Medicine.  Surgery. Follow these instructions at home: Eating and drinking   Follow a diet as told by your doctor. You may need to avoid foods and drinks such as: ? Coffee and tea (with or without caffeine). ? Drinks that contain alcohol. ? Energy drinks and sports drinks. ? Bubbly (carbonated) drinks or sodas. ? Chocolate and cocoa. ? Peppermint and mint flavorings. ? Garlic and onions. ? Horseradish. ? Spicy and acidic foods. These include peppers, chili powder, curry powder, vinegar, hot sauces, and BBQ sauce. ? Citrus fruit juices and citrus fruits, such as oranges, lemons, and limes. ? Tomato-based foods. These include red sauce, chili, salsa, and pizza with red sauce. ? Fried and fatty foods. These include donuts, french fries, potato chips, and high-fat dressings. ? High-fat meats. These include hot dogs, rib eye steak, sausage, ham, and bacon. ? High-fat dairy items, such as whole milk, butter, and cream cheese.  Eat small meals often. Avoid eating large meals.  Avoid drinking large amounts of liquid with your meals.  Avoid eating meals during the 2-3 hours before bedtime.  Avoid lying down right after you eat.  Do not exercise right after you eat. Lifestyle   Do not use any products that contain nicotine or tobacco. These include cigarettes, e-cigarettes, and chewing tobacco. If you need help quitting, ask your doctor.  Try to lower your stress. If you need help doing  this, ask your doctor.  If you are overweight, lose an amount of weight that is healthy for you. Ask your doctor about a safe weight loss goal. General instructions  Pay attention to any changes in your symptoms.  Take over-the-counter and prescription medicines only as told by your doctor. Do not take aspirin, ibuprofen, or other NSAIDs unless your  doctor says it is okay.  Wear loose clothes. Do not wear anything tight around your waist.  Raise (elevate) the head of your bed about 6 inches (15 cm).  Avoid bending over if this makes your symptoms worse.  Keep all follow-up visits as told by your doctor. This is important. Contact a doctor if:  You have new symptoms.  You lose weight and you do not know why.  You have trouble swallowing or it hurts to swallow.  You have wheezing or a cough that keeps happening.  Your symptoms do not get better with treatment.  You have a hoarse voice. Get help right away if:  You have pain in your arms, neck, jaw, teeth, or back.  You feel sweaty, dizzy, or light-headed.  You have chest pain or shortness of breath.  You throw up (vomit) and your throw-up looks like blood or coffee grounds.  You pass out (faint).  Your poop (stool) is bloody or black.  You cannot swallow, drink, or eat. Summary  If a person has gastroesophageal reflux disease (GERD), food and stomach acid move back up into the esophagus and cause symptoms or problems such as damage to the esophagus.  Treatment will depend on how bad your symptoms are.  Follow a diet as told by your doctor.  Take all medicines only as told by your doctor. This information is not intended to replace advice given to you by your health care provider. Make sure you discuss any questions you have with your health care provider. Document Released: 08/29/2007 Document Revised: 09/18/2017 Document Reviewed: 09/18/2017 Elsevier Patient Education  2020 [place] for Gastroesophageal Reflux Disease, Adult When you have gastroesophageal reflux disease (GERD), the foods you eat and your eating habits are very important. Choosing the right foods can help ease your discomfort. Think about working with a nutrition specialist (dietitian) to help you make good choices. What are tips for following this plan?  Meals  Choose  healthy foods that are low in fat, such as fruits, vegetables, whole grains, low-fat dairy products, and lean meat, fish, and poultry.  Eat small meals often instead of 3 large meals a day. Eat your meals slowly, and in a place where you are relaxed. Avoid bending over or lying down until 2-3 hours after eating.  Avoid eating meals 2-3 hours before bed.  Avoid drinking a lot of liquid with meals.  Cook foods using methods other than frying. Bake, grill, or broil food instead.  Avoid or limit: ? Chocolate. ? Peppermint or spearmint. ? Alcohol. ? Pepper. ? Black and decaffeinated coffee. ? Black and decaffeinated tea. ? Bubbly (carbonated) soft drinks. ? Caffeinated energy drinks and soft drinks.  Limit high-fat foods such as: ? Fatty meat or fried foods. ? Whole milk, cream, butter, or ice cream. ? Nuts and nut butters. ? Pastries, donuts, and sweets made with butter or shortening.  Avoid foods that cause symptoms. These foods may be different for everyone. Common foods that cause symptoms include: ? Tomatoes. ? Oranges, lemons, and limes. ? Peppers. ? Spicy food. ? Onions and garlic. ? Vinegar. Lifestyle  Maintain a healthy weight. Ask your doctor what weight is healthy for you. If you need to lose weight, work with your doctor to do so safely.  Exercise for at least 30 minutes for 5 or more days each week, or as told by your doctor.  Wear loose-fitting clothes.  Do not smoke. If you need help quitting, ask your doctor.  Sleep with the head of your bed higher than your feet. Use a wedge under the mattress or blocks under the bed frame to raise the head of the bed. Summary  When you have gastroesophageal reflux disease (GERD), food and lifestyle choices are very important in easing your symptoms.  Eat small meals often instead of 3 large meals a day. Eat your meals slowly, and in a place where you are relaxed.  Limit high-fat foods such as fatty meat or fried  foods.  Avoid bending over or lying down until 2-3 hours after eating.  Avoid peppermint and spearmint, caffeine, alcohol, and chocolate. This information is not intended to replace advice given to you by your health care provider. Make sure you discuss any questions you have with your health care provider. Document Released: 09/11/2011 Document Revised: 07/03/2018 Document Reviewed: 04/17/2016 Elsevier Patient Education  2020 [employer].  Cholelithiasis  Cholelithiasis is a form of gallbladder disease in which gallstones form in the gallbladder. The gallbladder is an organ that stores bile. Bile is made in the liver, and it helps to digest fats. Gallstones begin as small crystals and slowly grow into stones. They may cause no symptoms until the gallbladder tightens (contracts) and a gallstone is blocking the duct (gallbladder attack), which can cause pain. Cholelithiasis is also referred to as gallstones. There are two main types of gallstones:  Cholesterol stones. These are made of hardened cholesterol and are usually yellow-green in color. They are the most common type of gallstone. Cholesterol is a white, waxy, fat-like substance that is made in the liver.  Pigment stones. These are dark in color and are made of a red-yellow substance that forms when hemoglobin from red blood cells breaks down (bilirubin). What are the causes? This condition may be caused by an imbalance in the substances that bile is made of. This can happen if the bile:  Has too much bilirubin.  Has too much cholesterol.  Does not have enough bile salts. These salts help the body absorb and digest fats. In some cases, this condition can also be caused by the gallbladder not emptying completely or often enough. What increases the risk? The following factors may make you more likely to develop this condition:  Being female.  Having multiple pregnancies. Health care providers sometimes advise removing diseased  gallbladders before future pregnancies.  Eating a diet that is heavy in fried foods, fat, and refined carbohydrates, like white bread and white rice.  Being obese.  Being older than age 66.  Prolonged use of medicines that contain female hormones (estrogen).  Having diabetes mellitus.  Rapidly losing weight.  Having a family history of gallstones.  Being of [place] or [place] descent.  Having an intestinal disease such as Crohn disease.  Having metabolic syndrome.  Having cirrhosis.  Having severe types of anemia such as sickle cell anemia. What are the signs or symptoms? In most cases, there are no symptoms. These are known as silent gallstones. If a gallstone blocks the bile ducts, it can cause a gallbladder attack. The main symptom of a gallbladder attack is sudden pain in the  upper right abdomen. The pain usually comes at night or after eating a large meal. The pain can last for one or several hours and can spread to the right shoulder or chest. If the bile duct is blocked for more than a few hours, it can cause infection or inflammation of the gallbladder, liver, or pancreas, which may cause:  Nausea.  Vomiting.  Abdominal pain that lasts for 5 hours or more.  Fever or chills.  Yellowing of the skin or the whites of the eyes (jaundice).  Dark urine.  Light-colored stools. How is this diagnosed? This condition may be diagnosed based on:  A physical exam.  Your medical history.  An ultrasound of your gallbladder.  CT scan.  MRI.  Blood tests to check for signs of infection or inflammation.  A scan of your gallbladder and bile ducts (biliary system) using nonharmful radioactive material and special cameras that can see the radioactive material (cholescintigram). This test checks to see how your gallbladder contracts and whether bile ducts are blocked.  Inserting a small tube with a camera on the end (endoscope) through your mouth to inspect bile  ducts and check for blockages (endoscopic retrograde cholangiopancreatogram). How is this treated? Treatment for gallstones depends on the severity of the condition. Silent gallstones do not need treatment. If the gallstones cause a gallbladder attack or other symptoms, treatment may be required. Options for treatment include:  Surgery to remove the gallbladder (cholecystectomy). This is the most common treatment.  Medicines to dissolve gallstones. These are most effective at treating small gallstones. You may need to take medicines for up to 6-12 months.  Shock wave treatment (extracorporeal biliary lithotripsy). In this treatment, an ultrasound machine sends shock waves to the gallbladder to break gallstones into smaller pieces. These pieces can then be passed into the intestines or be dissolved by medicine. This is rarely used.  Removing gallstones through endoscopic retrograde cholangiopancreatogram. A small basket can be attached to the endoscope and used to capture and remove gallstones. Follow these instructions at home:  Take over-the-counter and prescription medicines only as told by your health care provider.  Maintain a healthy weight and follow a healthy diet. This includes: ? Reducing fatty foods, such as fried food. ? Reducing refined carbohydrates, like white bread and white rice. ? Increasing fiber. Aim for foods like almonds, fruit, and beans.  Keep all follow-up visits as told by your health care provider. This is important. Contact a health care provider if:  You think you have had a gallbladder attack.  You have been diagnosed with silent gallstones and you develop abdominal pain or indigestion. Get help right away if:  You have pain from a gallbladder attack that lasts for more than 2 hours.  You have abdominal pain that lasts for more than 5 hours.  You have a fever or chills.  You have persistent nausea and vomiting.  You develop jaundice.  You have dark  urine or light-colored stools. Summary  Cholelithiasis (also called gallstones) is a form of gallbladder disease in which gallstones form in the gallbladder.  This condition is caused by an imbalance in the substances that make up bile. This can happen if the bile has too much cholesterol, too much bilirubin, or not enough bile salts.  You are more likely to develop this condition if you are female, pregnant, using medicines with estrogen, obese, older than age 84, or have a family history of gallstones. You may also develop gallstones if you have diabetes,  an intestinal disease, cirrhosis, or metabolic syndrome.  Treatment for gallstones depends on the severity of the condition. Silent gallstones do not need treatment.  If gallstones cause a gallbladder attack or other symptoms, treatment may be needed. The most common treatment is surgery to remove the gallbladder. This information is not intended to replace advice given to you by your health care provider. Make sure you discuss any questions you have with your health care provider. Document Released: 03/08/2005 Document Revised: 02/22/2017 Document Reviewed: 11/27/2015 Elsevier Patient Education  2020 [place] is skin irritation or inflammation (dermatitis) that occurs when folds of skin rub together. The irritation can cause a rash and make skin raw and itchy. This condition most commonly occurs in the skin folds of these areas:  Toes.  Armpits.  Groin.  Under the belly.  Under the breasts.  Buttocks. Intertrigo is not passed from person to person (is not contagious). What are the causes? This condition is caused by heat, moisture, rubbing (friction), and not enough air circulation. The condition can be made worse by:  Sweat.  Bacteria.  A fungus, such as yeast. What increases the risk? This condition is more likely to occur if you have moisture in your skin folds. You are more likely to  develop this condition if you:  Have diabetes.  Are overweight.  Are not able to move around or are not active.  Live in a warm and moist climate.  Wear splints, braces, or other medical devices.  Are not able to control your bowels or bladder (have incontinence). What are the signs or symptoms? Symptoms of this condition include:  A pink or red skin rash in the skin fold or near the skin fold.  Raw or scaly skin.  Itchiness.  A burning feeling.  Bleeding.  Leaking fluid.  A bad smell. How is this diagnosed? This condition is diagnosed with a medical history and physical exam. You may also have a skin swab to test for bacteria or a fungus. How is this treated? This condition may be treated by:  Cleaning and drying your skin.  Taking an antibiotic medicine or using an antibiotic skin cream for a bacterial infection.  Using an antifungal cream on your skin or taking pills for an infection that was caused by a fungus, such as yeast.  Using a steroid ointment to relieve itchiness and irritation.  Separating the skin fold with a clean cotton cloth to absorb moisture and allow air to flow into the area. Follow these instructions at home:  Keep the affected area clean and dry.  Do not scratch your skin.  Stay in a cool environment as much as possible. Use an air conditioner or fan, if available.  Apply over-the-counter and prescription medicines only as told by your health care provider.  If you were prescribed an antibiotic medicine, use it as told by your health care provider. Do not stop using the antibiotic even if your condition improves.  Keep all follow-up visits as told by your health care provider. This is important. How is this prevented?   Maintain a healthy weight.  Take care of your feet, especially if you have diabetes. Foot care includes: ? Wearing shoes that fit well. ? Keeping your feet dry. ? Wearing clean, breathable socks.  Protect the  skin around your groin and buttocks, especially if you have incontinence. Skin protection includes: ? Following a regular cleaning routine. ? Using skin protectant creams, powders, or ointments. ? Changing  protection pads frequently.  Do not wear tight clothes. Wear clothes that are loose, absorbent, and made of cotton.  Wear a bra that gives good support, if needed.  Shower and dry yourself well after activity or exercise. Use a hair dryer on a cool setting to dry between skin folds, especially after you bathe.  If you have diabetes, keep your blood sugar under control. Contact a health care provider if:  Your symptoms do not improve with treatment.  Your symptoms get worse or they spread.  You notice increased redness and warmth.  You have a fever. Summary  Intertrigo is skin irritation or inflammation (dermatitis) that occurs when folds of skin rub together.  This condition is caused by heat, moisture, rubbing (friction), and not enough air circulation.  This condition may be treated by cleaning and drying your skin and with medicines.  Apply over-the-counter and prescription medicines only as told by your health care provider.  Keep all follow-up visits as told by your health care provider. This is important. This information is not intended to replace advice given to you by your health care provider. Make sure you discuss any questions you have with your health care provider. Document Released: 03/12/2005 Document Revised: 08/12/2017 Document Reviewed: 08/12/2017 Elsevier Patient Education  2020 [employer].

## 2019-01-07 ENCOUNTER — Encounter: Payer: Self-pay | Admitting: Internal Medicine

## 2019-01-07 DIAGNOSIS — M7122 Synovial cyst of popliteal space [Baker], left knee: Secondary | ICD-10-CM | POA: Insufficient documentation

## 2019-01-07 DIAGNOSIS — E039 Hypothyroidism, unspecified: Secondary | ICD-10-CM | POA: Insufficient documentation

## 2019-01-16 ENCOUNTER — Other Ambulatory Visit (INDEPENDENT_AMBULATORY_CARE_PROVIDER_SITE_OTHER): Payer: Medicare Other

## 2019-01-16 ENCOUNTER — Other Ambulatory Visit: Payer: Self-pay | Admitting: Internal Medicine

## 2019-01-16 ENCOUNTER — Encounter: Payer: Self-pay | Admitting: Internal Medicine

## 2019-01-16 ENCOUNTER — Other Ambulatory Visit: Payer: Self-pay

## 2019-01-16 DIAGNOSIS — R739 Hyperglycemia, unspecified: Secondary | ICD-10-CM

## 2019-01-16 DIAGNOSIS — N3000 Acute cystitis without hematuria: Secondary | ICD-10-CM | POA: Diagnosis not present

## 2019-01-16 DIAGNOSIS — I10 Essential (primary) hypertension: Secondary | ICD-10-CM | POA: Diagnosis not present

## 2019-01-16 DIAGNOSIS — E559 Vitamin D deficiency, unspecified: Secondary | ICD-10-CM

## 2019-01-16 DIAGNOSIS — R7303 Prediabetes: Secondary | ICD-10-CM | POA: Insufficient documentation

## 2019-01-16 DIAGNOSIS — E785 Hyperlipidemia, unspecified: Secondary | ICD-10-CM | POA: Diagnosis not present

## 2019-01-16 DIAGNOSIS — E039 Hypothyroidism, unspecified: Secondary | ICD-10-CM | POA: Diagnosis not present

## 2019-01-16 LAB — HEMOGLOBIN A1C: Hgb A1c MFr Bld: 5.9 % (ref 4.6–6.5)

## 2019-01-16 LAB — CBC WITH DIFFERENTIAL/PLATELET
Basophils Absolute: 0 10*3/uL (ref 0.0–0.1)
Basophils Relative: 0.6 % (ref 0.0–3.0)
Eosinophils Absolute: 0.2 10*3/uL (ref 0.0–0.7)
Eosinophils Relative: 3.1 % (ref 0.0–5.0)
HCT: 40.5 % (ref 36.0–46.0)
Hemoglobin: 13.3 g/dL (ref 12.0–15.0)
Lymphocytes Relative: 26.1 % (ref 12.0–46.0)
Lymphs Abs: 1.6 10*3/uL (ref 0.7–4.0)
MCHC: 32.8 g/dL (ref 30.0–36.0)
MCV: 98.6 fl (ref 78.0–100.0)
Monocytes Absolute: 0.6 10*3/uL (ref 0.1–1.0)
Monocytes Relative: 9.8 % (ref 3.0–12.0)
Neutro Abs: 3.8 10*3/uL (ref 1.4–7.7)
Neutrophils Relative %: 60.4 % (ref 43.0–77.0)
Platelets: 224 10*3/uL (ref 150.0–400.0)
RBC: 4.11 Mil/uL (ref 3.87–5.11)
RDW: 13.2 % (ref 11.5–15.5)
WBC: 6.3 10*3/uL (ref 4.0–10.5)

## 2019-01-16 LAB — LIPID PANEL
Cholesterol: 134 mg/dL (ref 0–200)
HDL: 56.2 mg/dL (ref >39.00)
LDL Cholesterol: 61 mg/dL (ref 0–99)
NonHDL: 77.3
Total CHOL/HDL Ratio: 2
Triglycerides: 83 mg/dL (ref 0.0–149.0)
VLDL: 16.6 mg/dL (ref 0.0–40.0)

## 2019-01-16 LAB — COMPREHENSIVE METABOLIC PANEL
ALT: 16 U/L (ref 0–35)
AST: 21 U/L (ref 0–37)
Albumin: 4.2 g/dL (ref 3.5–5.2)
Alkaline Phosphatase: 65 U/L (ref 39–117)
BUN: 17 mg/dL (ref 6–23)
CO2: 31 mEq/L (ref 19–32)
Calcium: 9.6 mg/dL (ref 8.4–10.5)
Chloride: 102 mEq/L (ref 96–112)
Creatinine, Ser: 0.97 mg/dL (ref 0.40–1.20)
GFR: 55.76 mL/min — ABNORMAL LOW (ref >60.00)
Glucose, Bld: 118 mg/dL — ABNORMAL HIGH (ref 70–99)
Potassium: 4.2 mEq/L (ref 3.5–5.1)
Sodium: 138 mEq/L (ref 135–145)
Total Bilirubin: 0.5 mg/dL (ref 0.2–1.2)
Total Protein: 7.7 g/dL (ref 6.0–8.3)

## 2019-01-16 LAB — VITAMIN D 25 HYDROXY (VIT D DEFICIENCY, FRACTURES): VITD: 61.51 ng/mL (ref 30.00–100.00)

## 2019-01-16 LAB — TSH: TSH: 3.94 u[IU]/mL (ref 0.35–4.50)

## 2019-01-17 LAB — URINE CULTURE
MICRO NUMBER:: 1023657
SPECIMEN QUALITY:: ADEQUATE

## 2019-01-17 LAB — URINALYSIS, ROUTINE W REFLEX MICROSCOPIC
Bacteria, UA: NONE SEEN /HPF
Bilirubin Urine: NEGATIVE
Glucose, UA: NEGATIVE
Hgb urine dipstick: NEGATIVE
Hyaline Cast: NONE SEEN /LPF
Ketones, ur: NEGATIVE
Nitrite: NEGATIVE
Protein, ur: NEGATIVE
RBC / HPF: NONE SEEN /HPF (ref 0–2)
Specific Gravity, Urine: 1.009 (ref 1.001–1.03)
pH: 7.5 (ref 5.0–8.0)

## 2019-01-19 ENCOUNTER — Ambulatory Visit: Payer: Medicare Other

## 2019-01-21 ENCOUNTER — Ambulatory Visit
Admission: RE | Admit: 2019-01-21 | Discharge: 2019-01-21 | Disposition: A | Discharge status: Home / Self Care | Payer: Medicare Other | Source: Ambulatory Visit | Attending: Internal Medicine | Admitting: Internal Medicine

## 2019-01-21 ENCOUNTER — Other Ambulatory Visit: Payer: Self-pay

## 2019-01-21 DIAGNOSIS — R1011 Right upper quadrant pain: Secondary | ICD-10-CM | POA: Diagnosis present

## 2019-01-30 ENCOUNTER — Ambulatory Visit: Payer: Medicare Other | Admitting: Internal Medicine

## 2019-01-30 ENCOUNTER — Other Ambulatory Visit: Payer: Self-pay

## 2019-01-30 VITALS — Ht 61.0 in | Wt 144.0 lb

## 2019-01-30 DIAGNOSIS — E785 Hyperlipidemia, unspecified: Secondary | ICD-10-CM

## 2019-01-30 DIAGNOSIS — R7303 Prediabetes: Secondary | ICD-10-CM

## 2019-01-30 DIAGNOSIS — M8000XD Age-related osteoporosis with current pathological fracture, unspecified site, subsequent encounter for fracture with routine healing: Secondary | ICD-10-CM

## 2019-01-30 DIAGNOSIS — R1011 Right upper quadrant pain: Secondary | ICD-10-CM | POA: Insufficient documentation

## 2019-01-30 DIAGNOSIS — K219 Gastro-esophageal reflux disease without esophagitis: Secondary | ICD-10-CM | POA: Insufficient documentation

## 2019-01-30 DIAGNOSIS — E039 Hypothyroidism, unspecified: Secondary | ICD-10-CM

## 2019-01-30 HISTORY — DX: Right upper quadrant pain: R10.11

## 2019-01-30 NOTE — Progress Notes (Addendum)
Telephone Note  I connected with Amy Montes  on 01/30/19 at 10:00 AM EST by telephone and verified that I am speaking with the correct person using two identifiers.  Location patient: home Location provider:work or home office Persons participating in the virtual visit: patient, provider  I discussed the limitations of evaluation and management by telemedicine and the availability of in person appointments. The patient expressed understanding and agreed to proceed.   HPI: 1. Reviewed labs 01/16/2019 a1C 5.9 prediabetes  2. Still has epigastric RUQ pain mild [place] ab negative 01/16/2019 3. gerd wants to know what to take since zantac recalled disc'ed tums and pepcid  4. Hypothyroidism on levo tsh normal cont same dose 88  ROS: See pertinent positives and negatives per HPI.  Past Medical History:  Diagnosis Date  . Asthma    childhood  . Chicken pox   . Chronic back pain    Said spontaneous thoracic and lumbar spine fractures secondary to falls.  . Colon polyps   . Coronary artery disease involving native coronary artery without angina pectoris 07/2016   In setting of inferior STEMI, cardiac cath showed occluded RCA.  Too small for PCI  . Essential hypertension   . Glaucoma   . 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  Injured her face. ->  Both aspirin and Plavix were stopped. ->  No residual findings  . Hearing loss   . 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 (no stents)   . Hyperlipidemia with target LDL less than 70    On rosuvastatin  . Hypothyroidism (acquired)    On Synthroid  . Intracranial hemorrhage ([place])    s/p fall years ago when on ? plavix so this was stopped   . Multiple fracture    x 5 vertebrae   . Urinary incontinence   . UTI (urinary tract infection)     Past Surgical History:  Procedure Laterality Date  . ABDOMINAL HYSTERECTOMY     2/2 endometriosis  and DUB  . BLADDER REPAIR    . cataract surgery     b/l  . CESAREAN SECTION    . 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 Sabra Heck in [facility]  . CRANIOTOMY     SAH evaccuation  . RIGHT AND LEFT HEART CATH  07/2016   Inferior STEMI.  Culprit lesion mRPDA = was too small to address.  20% pLAD.  LCx-LPL normal.  Patent.  RCA ostial 30% RCA dominant.  Hazy plaque rupture and a small 1.5 mm PDA. --> LV Gram EF 50%, Inf HK. Increased LVEDP  . TONSILLECTOMY    . TRANSTHORACIC ECHOCARDIOGRAM  11/2015   Right is being within normal limits.    Family History  Problem Relation Age of Onset  . Other Mother        Old age  . Hearing loss Mother   . Cancer Father        colon died in 39  . CVA Father   . Lupus Sister   . Lung cancer Brother   . Cancer Brother        lung smoker  . Heart disease Maternal Grandmother   . Dementia Sister   . Colon cancer Brother   . Cancer Brother        colon  . Cancer Son        testicular cancer  dx age 30 now 80 as of 12/2018     SOCIAL HX:   Recently moved from [place], [place].->  Now lives in [place], [place] Lives alone, but very close to her son Fara Olden and his wife. Her daughter lives in [place], [place] She has 3 grandchildren. widow  She drinks coffee.   She does water aerobics/pool exercises.  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  Prior to moving to [place], she was the primary caregiver for her brother who passed away in 07-01-2017.  Once he died, her children convinced her to move up to [place] to be near them.  6 other siblings 2 1/2 siblings but those 1/2 siblings died in young age  Current Outpatient Medications:  .  aspirin 81 MG chewable tablet, Chew by mouth daily., Disp: , Rfl:  .  Calcium Carb-Cholecalciferol (CALCIUM 1000 + D PO), Take by mouth 2 (two) times daily. , Disp: , Rfl:  .  carvedilol (COREG) 12.5 MG tablet, Take 1 tablet (12.5 mg total) by mouth 2 (two) times daily with a meal., Disp: 180 tablet, Rfl: 3 .   ESTROGENS CONJ SYNTHETIC B PO, Take by mouth., Disp: , Rfl:  .  levothyroxine (SYNTHROID, LEVOTHROID) 88 MCG tablet, Take 88 mcg by mouth daily before breakfast., Disp: , Rfl:  .  losartan (COZAAR) 50 MG tablet, Take 1 tablet (50 mg total) by mouth 2 (two) times daily., Disp: 180 tablet, Rfl: 3 .  LOTEMAX 0.5 % ophthalmic suspension, 2 (two) times daily. , Disp: , Rfl:  .  Multiple Vitamins-Minerals (MULTIVITAMIN ADULTS PO), Take by mouth., Disp: , Rfl:  .  rosuvastatin (CRESTOR) 20 MG tablet, Take 1 tablet (20 mg total) by mouth daily., Disp: 90 tablet, Rfl: 3 .  VITAMIN D PO, Take 4,000 Int'l Units by mouth daily., Disp: , Rfl:   EXAM:  VITALS per patient if applicable:  GENERAL: alert, oriented, appears well and in no acute distress  PSYCH/NEURO: pleasant and cooperative, no obvious depression or anxiety, speech and thought processing grossly intact  ASSESSMENT AND PLAN:  Discussed the following assessment and plan:  Prediabetes A1C 5.9 rec healthy diet choices  Hypothyroidism, unspecified type tsh normal cont medslevo 88 mcg   Hyperlipidemia with target LDL less than 70 rec healthy diet and exercise cont crestor 20 mg qhs  Gastroesophageal reflux disease, unspecified whether esophagitis present Given diet list disc tums or pepcid   RUQ abdominal pain [place] neg 01/16/19 consider GI referral in future if not better vs HIDA scan  HM Declines flu shot and other vaccines prev  pna shot per pt had in 07/02/14 ? Which one  Had zostervax at [facility] in [place] declines shingrix  Declines vaccines for today   Mammogram sch 04/14/19 Colonoscopy with h/o polyps in 07/01/2013 last age 58 did not do and may like to do stool kids in future   S/p hysterectomy with BSO 1989 DUB endometrosis out of age window  DEXA in future  ? Had 06/12/16 cant read copyh/o osteopenia/porosis with comp. fxs need to get copy of dexa was on prolia last shot 04/2018   -prolia injection had 05/26/2018, 10/10/17, 04/01/17, 09/24/16    -she was also on forteo 08/07/16   -DEXA 09/2015 T score -2.5 +compression fractures T12 and L1-4 Consider derm in future   Former PCP Dr. Lubertha Basque in [place] [phone] (get copy of labs, vaccines, mammogram, colonoscopy, DEXA, imaging) release faxed  -2nd request 01/30/2019  Reviewed records Dr. August Saucer I.e labs and Dr. Jimmye Norman  Compression fx T12, L1-4    Cards Dr. Glenetta Hew in [place]  Dr. Brigitte Pulse eye in [place] in [facility] [phone] Eye surgery in [facility] Dr. Sabra Heck   -we discussed possible serious and likely etiologies, options for evaluation and workup, limitations of telemedicine visit vs in person visit, treatment, treatment risks and precautions. Pt prefers to treat via telemedicine empirically rather then risking or undertaking an in person visit at this moment. Patient agrees to seek prompt in person care if worsening, new symptoms arise, or if is not improving with treatment.   I discussed the assessment and treatment plan with the patient. The patient was provided an opportunity to ask questions and all were answered. The patient agreed with the plan and demonstrated an understanding of the instructions.   The patient was advised to call back or seek an in-person evaluation if the symptoms worsen or if the condition fails to improve as anticipated.  Time spent 20 minutes  Delorise Jackson, MD

## 2019-01-30 NOTE — Patient Instructions (Addendum)
Famotidine tablet What is this medicine? FAMOTIDINE (fa MOE ti deen) is a type of antihistamine that blocks the release of stomach acid. It is used to treat stomach or intestinal ulcers. It can also relieve heartburn from acid reflux. This medicine may be used for other purposes; ask your health care provider or pharmacist if you have questions. COMMON BRAND NAME(S): Pepcid AC What should I tell my health care provider before I take this medicine? They need to know if you have any of these conditions:  kidney or liver disease  phenylketonuria  trouble swallowing  an unusual or allergic reaction to famotidine, other medicines, foods, dyes, or preservatives  pregnant or trying to get pregnant  breast-feeding How should I use this medicine? Take this medicine by mouth. Chew it completely before swallowing. Follow the directions on the prescription label. Take your doses at regular intervals. Do not take your medicine more often than directed. Talk to your pediatrician regarding the use of this medicine in children. While this medicine may be prescribed for children for selected conditions, precautions do apply. Overdosage: If you think you have taken too much of this medicine contact a poison control center or emergency room at once. NOTE: This medicine is only for you. Do not share this medicine with others. What if I miss a dose? If you miss a dose, take it as soon as you can. If it is almost time for your next dose, take only that dose. Do not take double or extra doses. What may interact with this medicine?  delavirdine  itraconazole  ketoconazole This list may not describe all possible interactions. Give your health care provider a list of all the medicines, herbs, non-prescription drugs, or dietary supplements you use. Also tell them if you smoke, drink alcohol, or use illegal drugs. Some items may interact with your medicine. What should I watch for while using this medicine?  Tell your doctor or health care professional if your condition does not start to get better or if it gets worse. Finish the full course of tablets prescribed, even if you feel better. Do not take with aspirin, ibuprofen or other antiinflammatory medicines. These can make your condition worse. Do not smoke cigarettes or drink alcohol. These cause irritation in your stomach and can increase the time it will take for ulcers to heal. If you get black, tarry stools or vomit up what looks like coffee grounds, call your doctor or health care professional at once. You may have a bleeding ulcer. If you have phenylketonuria you should not use this medicine as it contains phenylalanine. This medicine may cause a decrease in vitamin B12. You should make sure that you get enough vitamin B12 while you are taking this medicine. Discuss the foods you eat and the vitamins you take with your health care professional. What side effects may I notice from receiving this medicine? Side effects that you should report to your doctor or health care professional as soon as possible:  agitation, nervousness  confusion  hallucinations  skin rash, itching Side effects that usually do not require medical attention (report to your doctor or health care professional if they continue or are bothersome):  constipation  diarrhea  dizziness  headache This list may not describe all possible side effects. Call your doctor for medical advice about side effects. You may report side effects to FDA at 1-800-FDA-1088. Where should I keep my medicine? Keep out of the reach of children. Store at room temperature between 20 and  30 degrees C (68 and 86 degrees F). Protect from moisture. Throw away any unused medicine after the expiration date. NOTE: This sheet is a summary. It may not cover all possible information. If you have questions about this medicine, talk to your doctor, pharmacist, or health care provider.  2020  Elsevier/Gold Standard (2016-10-26 13:16:04)  Gastroesophageal Reflux Disease, Adult Gastroesophageal reflux (GER) happens when acid from the stomach flows up into the tube that connects the mouth and the stomach (esophagus). Normally, food travels down the esophagus and stays in the stomach to be digested. However, when a person has GER, food and stomach acid sometimes move back up into the esophagus. If this becomes a more serious problem, the person may be diagnosed with a disease called gastroesophageal reflux disease (GERD). GERD occurs when the reflux:  Happens often.  Causes frequent or severe symptoms.  Causes problems such as damage to the esophagus. When stomach acid comes in contact with the esophagus, the acid may cause soreness (inflammation) in the esophagus. Over time, GERD may create small holes (ulcers) in the lining of the esophagus. What are the causes? This condition is caused by a problem with the muscle between the esophagus and the stomach (lower esophageal sphincter, or LES). Normally, the LES muscle closes after food passes through the esophagus to the stomach. When the LES is weakened or abnormal, it does not close properly, and that allows food and stomach acid to go back up into the esophagus. The LES can be weakened by certain dietary substances, medicines, and medical conditions, including:  Tobacco use.  Pregnancy.  Having a hiatal hernia.  Alcohol use.  Certain foods and beverages, such as coffee, chocolate, onions, and peppermint. What increases the risk? You are more likely to develop this condition if you:  Have an increased body weight.  Have a connective tissue disorder.  Use NSAID medicines. What are the signs or symptoms? Symptoms of this condition include:  Heartburn.  Difficult or painful swallowing.  The feeling of having a lump in the throat.  Abitter taste in the mouth.  Bad breath.  Having a large amount of saliva.  Having  an upset or bloated stomach.  Belching.  Chest pain. Different conditions can cause chest pain. Make sure you see your health care provider if you experience chest pain.  Shortness of breath or wheezing.  Ongoing (chronic) cough or a night-time cough.  Wearing away of tooth enamel.  Weight loss. How is this diagnosed? Your health care provider will take a medical history and perform a physical exam. To determine if you have mild or severe GERD, your health care provider may also monitor how you respond to treatment. You may also have tests, including:  A test to examine your stomach and esophagus with a small camera (endoscopy).  A test thatmeasures the acidity level in your esophagus.  A test thatmeasures how much pressure is on your esophagus.  A barium swallow or modified barium swallow test to show the shape, size, and functioning of your esophagus. How is this treated? The goal of treatment is to help relieve your symptoms and to prevent complications. Treatment for this condition may vary depending on how severe your symptoms are. Your health care provider may recommend:  Changes to your diet.  Medicine.  Surgery. Follow these instructions at home: Eating and drinking   Follow a diet as recommended by your health care provider. This may involve avoiding foods and drinks such as: ? Coffee and tea (  with or without caffeine). ? Drinks that containalcohol. ? Energy drinks and sports drinks. ? Carbonated drinks or sodas. ? Chocolate and cocoa. ? Peppermint and mint flavorings. ? Garlic and onions. ? Horseradish. ? Spicy and acidic foods, including peppers, chili powder, curry powder, vinegar, hot sauces, and barbecue sauce. ? Citrus fruit juices and citrus fruits, such as oranges, lemons, and limes. ? Tomato-based foods, such as red sauce, chili, salsa, and pizza with red sauce. ? Fried and fatty foods, such as donuts, french fries, potato chips, and high-fat  dressings. ? High-fat meats, such as hot dogs and fatty cuts of red and white meats, such as rib eye steak, sausage, ham, and bacon. ? High-fat dairy items, such as whole milk, butter, and cream cheese.  Eat small, frequent meals instead of large meals.  Avoid drinking large amounts of liquid with your meals.  Avoid eating meals during the 2-3 hours before bedtime.  Avoid lying down right after you eat.  Do not exercise right after you eat. Lifestyle   Do not use any products that contain nicotine or tobacco, such as cigarettes, e-cigarettes, and chewing tobacco. If you need help quitting, ask your health care provider.  Try to reduce your stress by using methods such as yoga or meditation. If you need help reducing stress, ask your health care provider.  If you are overweight, reduce your weight to an amount that is healthy for you. Ask your health care provider for guidance about a safe weight loss goal. General instructions  Pay attention to any changes in your symptoms.  Take over-the-counter and prescription medicines only as told by your health care provider. Do not take aspirin, ibuprofen, or other NSAIDs unless your health care provider told you to do so.  Wear loose-fitting clothing. Do not wear anything tight around your waist that causes pressure on your abdomen.  Raise (elevate) the head of your bed about 6 inches (15 cm).  Avoid bending over if this makes your symptoms worse.  Keep all follow-up visits as told by your health care provider. This is important. Contact a health care provider if:  You have: ? New symptoms. ? Unexplained weight loss. ? Difficulty swallowing or it hurts to swallow. ? Wheezing or a persistent cough. ? A hoarse voice.  Your symptoms do not improve with treatment. Get help right away if you:  Have pain in your arms, neck, jaw, teeth, or back.  Feel sweaty, dizzy, or light-headed.  Have chest pain or shortness of breath.  Vomit  and your vomit looks like blood or coffee grounds.  Faint.  Have stool that is bloody or black.  Cannot swallow, drink, or eat. Summary  Gastroesophageal reflux happens when acid from the stomach flows up into the esophagus. GERD is a disease in which the reflux happens often, causes frequent or severe symptoms, or causes problems such as damage to the esophagus.  Treatment for this condition may vary depending on how severe your symptoms are. Your health care provider may recommend diet and lifestyle changes, medicine, or surgery.  Contact a health care provider if you have new or worsening symptoms.  Take over-the-counter and prescription medicines only as told by your health care provider. Do not take aspirin, ibuprofen, or other NSAIDs unless your health care provider told you to do so.  Keep all follow-up visits as told by your health care provider. This is important. This information is not intended to replace advice given to you by your health care  provider. Make sure you discuss any questions you have with your health care provider. Document Released: 12/20/2004 Document Revised: 09/18/2017 Document Reviewed: 09/18/2017 Elsevier Patient Education  2020 [place] for Gastroesophageal Reflux Disease, Adult When you have gastroesophageal reflux disease (GERD), the foods you eat and your eating habits are very important. Choosing the right foods can help ease the discomfort of GERD. Consider working with a diet and nutrition specialist (dietitian) to help you make healthy food choices. What general guidelines should I follow?  Eating plan  Choose healthy foods low in fat, such as fruits, vegetables, whole grains, low-fat dairy products, and lean meat, fish, and poultry.  Eat frequent, small meals instead of three large meals each day. Eat your meals slowly, in a relaxed setting. Avoid bending over or lying down until 2-3 hours after eating.  Limit high-fat foods  such as fatty meats or fried foods.  Limit your intake of oils, butter, and shortening to less than 8 teaspoons each day.  Avoid the following: ? Foods that cause symptoms. These may be different for different people. Keep a food diary to keep track of foods that cause symptoms. ? Alcohol. ? Drinking large amounts of liquid with meals. ? Eating meals during the 2-3 hours before bed.  Cook foods using methods other than frying. This may include baking, grilling, or broiling. Lifestyle  Maintain a healthy weight. Ask your health care provider what weight is healthy for you. If you need to lose weight, work with your health care provider to do so safely.  Exercise for at least 30 minutes on 5 or more days each week, or as told by your health care provider.  Avoid wearing clothes that fit tightly around your waist and chest.  Do not use any products that contain nicotine or tobacco, such as cigarettes and e-cigarettes. If you need help quitting, ask your health care provider.  Sleep with the head of your bed raised. Use a wedge under the mattress or blocks under the bed frame to raise the head of the bed. What foods are not recommended? The items listed may not be a complete list. Talk with your dietitian about what dietary choices are best for you. Grains Pastries or quick breads with added fat. [place] toast. Vegetables Deep fried vegetables. [place] fries. Any vegetables prepared with added fat. Any vegetables that cause symptoms. For some people this may include tomatoes and tomato products, chili peppers, onions and garlic, and horseradish. Fruits Any fruits prepared with added fat. Any fruits that cause symptoms. For some people this may include citrus fruits, such as oranges, grapefruit, pineapple, and lemons. Meats and other protein foods High-fat meats, such as fatty beef or pork, hot dogs, ribs, ham, sausage, salami and bacon. Fried meat or protein, including fried fish and fried  chicken. Nuts and nut butters. Dairy Whole milk and chocolate milk. Sour cream. Cream. Ice cream. Cream cheese. Milk shakes. Beverages Coffee and tea, with or without caffeine. Carbonated beverages. Sodas. Energy drinks. Fruit juice made with acidic fruits (such as orange or grapefruit). Tomato juice. Alcoholic drinks. Fats and oils Butter. Margarine. Shortening. Ghee. Sweets and desserts Chocolate and cocoa. Donuts. Seasoning and other foods Pepper. Peppermint and spearmint. Any condiments, herbs, or seasonings that cause symptoms. For some people, this may include curry, hot sauce, or vinegar-based salad dressings. Summary  When you have gastroesophageal reflux disease (GERD), food and lifestyle choices are very important to help ease the discomfort of GERD.  Eat  frequent, small meals instead of three large meals each day. Eat your meals slowly, in a relaxed setting. Avoid bending over or lying down until 2-3 hours after eating.  Limit high-fat foods such as fatty meat or fried foods. This information is not intended to replace advice given to you by your health care provider. Make sure you discuss any questions you have with your health care provider. Document Released: 03/12/2005 Document Revised: 07/03/2018 Document Reviewed: 03/13/2016 Elsevier Patient Education  2020 [employer].

## 2019-02-17 ENCOUNTER — Other Ambulatory Visit: Payer: Self-pay | Admitting: Internal Medicine

## 2019-02-17 ENCOUNTER — Telehealth: Payer: Self-pay | Admitting: Internal Medicine

## 2019-02-17 DIAGNOSIS — M810 Age-related osteoporosis without current pathological fracture: Secondary | ICD-10-CM | POA: Insufficient documentation

## 2019-02-17 NOTE — Telephone Encounter (Signed)
Patient is ok with both. But she may stop prolia if her dexa doesn't show improvement per patient.

## 2019-02-17 NOTE — Telephone Encounter (Signed)
Please sch dexa   Thanks [place]

## 2019-02-17 NOTE — Telephone Encounter (Signed)
She needs repeat dexa and prolia injection due   Agreeable to both?    [place]

## 2019-03-11 ENCOUNTER — Ambulatory Visit
Admission: RE | Admit: 2019-03-11 | Discharge: 2019-03-11 | Disposition: A | Discharge status: Home / Self Care | Payer: Medicare Other | Source: Ambulatory Visit | Attending: Internal Medicine | Admitting: Internal Medicine

## 2019-03-11 DIAGNOSIS — M810 Age-related osteoporosis without current pathological fracture: Secondary | ICD-10-CM

## 2019-03-11 DIAGNOSIS — Z1231 Encounter for screening mammogram for malignant neoplasm of breast: Secondary | ICD-10-CM | POA: Insufficient documentation

## 2019-03-24 ENCOUNTER — Other Ambulatory Visit: Payer: Self-pay | Admitting: Internal Medicine

## 2019-03-24 MED ORDER — LEVOTHYROXINE SODIUM 88 MCG PO TABS: 88.0000 ug | ORAL_TABLET | Freq: Every day | ORAL | 3 refills | Status: DC

## 2019-03-24 MED ORDER — ROSUVASTATIN CALCIUM 20 MG PO TABS: 20.0000 mg | ORAL_TABLET | Freq: Every day | ORAL | 3 refills | Status: DC

## 2019-04-06 ENCOUNTER — Telehealth: Payer: Self-pay | Admitting: Internal Medicine

## 2019-04-06 NOTE — Telephone Encounter (Signed)
Patient did not want that ordered. She did not request.

## 2019-04-06 NOTE — Telephone Encounter (Signed)
Call pt did she order from pain management co back brace?   [place]

## 2019-04-07 NOTE — Telephone Encounter (Signed)
Call pt did she order from pain management co back/knee brace?    [name]

## 2019-04-07 NOTE — Telephone Encounter (Signed)
Report this please  Amy Montes

## 2019-04-07 NOTE — Telephone Encounter (Signed)
Pt states she did get another call from scammer and using the [facility] number.

## 2019-04-07 NOTE — Telephone Encounter (Signed)
Pt call back returning your call. It's ok to leave msg on vm.

## 2019-04-07 NOTE — Telephone Encounter (Signed)
She did not do that. Patient is saying someone with a foreign accent called from our number and is trying to get patients information. She stated they called 20+ times today.

## 2019-04-07 NOTE — Telephone Encounter (Signed)
Spoke with Patient to help clear up multiple calls. Patient has advised that after she thought about it she did receive several reminder calls. I verified with the  patient that she does have a confirmed visit tomorrow with Denisa. I also assured the patients that at any point she feels uncomfortable about a call she receives that it is ok for her to hang up and call back into the office directly. Patient thanked me for calling her and asked that I let Fransisco Beau and Dr. [name] know she appreciates [place] moving so quickly on this. No additional follow up needed at this time.

## 2019-04-08 ENCOUNTER — Encounter (INDEPENDENT_AMBULATORY_CARE_PROVIDER_SITE_OTHER): Payer: Self-pay

## 2019-04-08 ENCOUNTER — Other Ambulatory Visit: Payer: Self-pay

## 2019-04-08 ENCOUNTER — Ambulatory Visit (INDEPENDENT_AMBULATORY_CARE_PROVIDER_SITE_OTHER): Payer: Medicare Other

## 2019-04-08 VITALS — Ht 61.0 in | Wt 142.0 lb

## 2019-04-08 DIAGNOSIS — Z0000 Encounter for general adult medical examination without abnormal findings: Secondary | ICD-10-CM | POA: Diagnosis not present

## 2019-04-08 NOTE — Progress Notes (Signed)
Subjective:   Amy Montes is a 79 y.o. female who presents for an Initial Medicare Annual Wellness Visit.  Review of Systems    No ROS.  Medicare Wellness Virtual Visit.  Visual/audio telehealth visit, UTA vital signs.   Wt/Ht provided. See social history for additional risk factors.    Cardiac Risk Factors include: advanced age (>75men, >38 women);hypertension     Objective:    Today's Vitals   04/08/19 1104  Weight: 142 lb (64.4 kg)  Height: 5\' 1"  (1.549 m)   Body mass index is 26.83 kg/m.  Advanced Directives 04/08/2019 09/19/2017  Does Patient Have a Medical Advance Directive? Yes Yes  Type of Paramedic of [place];Living will Living will  Copy of [place] in Chart? No - copy requested -    Current Medications (verified) Outpatient Encounter Medications as of 04/08/2019  Medication Sig  . aspirin 81 MG chewable tablet Chew by mouth daily.  . carvedilol (COREG) 12.5 MG tablet Take 1 tablet (12.5 mg total) by mouth 2 (two) times daily with a meal.  . ESTROGENS CONJ SYNTHETIC B PO Take by mouth.  . levothyroxine (SYNTHROID) 88 MCG tablet Take 1 tablet (88 mcg total) by mouth daily before breakfast.  . losartan (COZAAR) 50 MG tablet Take 1 tablet (50 mg total) by mouth 2 (two) times daily.  Marland Kitchen LOTEMAX 0.5 % ophthalmic suspension 2 (two) times daily.   . Multiple Vitamins-Minerals (MULTIVITAMIN ADULTS PO) Take by mouth.  . rosuvastatin (CRESTOR) 20 MG tablet Take 1 tablet (20 mg total) by mouth daily.  Marland Kitchen VITAMIN D PO Take 4,000 Int'l Units by mouth daily.  . Calcium Carb-Cholecalciferol (CALCIUM 1000 + D PO) Take by mouth 2 (two) times daily.    No facility-administered encounter medications on file as of 04/08/2019.    Allergies (verified) Chocolate and Lactose intolerance (gi)   History: Past Medical History:  Diagnosis Date  . Asthma    childhood  . Chicken pox   . Chronic back pain    Said spontaneous thoracic  and lumbar spine fractures secondary to falls.  . Colon polyps   . Coronary artery disease involving native coronary artery without angina pectoris 07/2016   In setting of inferior STEMI, cardiac cath showed occluded RCA.  Too small for PCI  . Essential hypertension   . Glaucoma   . 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  Injured her face. ->  Both aspirin and Plavix were stopped. ->  No residual findings  . Hearing loss   . 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 (no stents)   . Hyperlipidemia with target LDL less than 70    On rosuvastatin  . Hypothyroidism (acquired)    On Synthroid  . 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 plavix so this was stopped   . Multiple fracture    x 5 vertebrae   . Urinary incontinence   . UTI (urinary tract infection)    Past Surgical History:  Procedure Laterality Date  . ABDOMINAL HYSTERECTOMY     2/2 endometriosis and DUB  . BLADDER REPAIR    . cataract surgery     b/l  . CESAREAN SECTION    . 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 Sabra Heck in [facility]  . CRANIOTOMY     SAH  evaccuation  . RIGHT AND LEFT HEART CATH  07/2016   Inferior STEMI.  Culprit lesion mRPDA = was too small to address.  20% pLAD.  LCx-LPL normal.  Patent.  RCA ostial 30% RCA dominant.  Hazy plaque rupture and a small 1.5 mm PDA. --> LV Gram EF 50%, Inf HK. Increased LVEDP  . TONSILLECTOMY    . TRANSTHORACIC ECHOCARDIOGRAM  11/2015   Right is being within normal limits.   Family History  Problem Relation Age of Onset  . Other Mother        Old age  . Hearing loss Mother   . Cancer Father        colon died in 41  . CVA Father   . Lupus Sister   . Lung cancer Brother   . Cancer Brother        lung smoker  . Heart disease Maternal Grandmother   . Dementia Sister   . Colon cancer Brother   . Cancer Brother         colon  . Cancer Son        testicular cancer dx age 79 now 82 as of 12/2018    Social History   Socioeconomic History  . Marital status: Single    Spouse name: Not on file  . Number of children: 2  . Years of education: Not on file  . Highest education level: High school graduate  Occupational History  . Occupation: Nanny  Tobacco Use  . Smoking status: Never Smoker  . Smokeless tobacco: Never Used  Substance and Sexual Activity  . Alcohol use: Never  . Drug use: Never  . Sexual activity: Not Currently  Other Topics Concern  . Not on file  Social History Narrative   Recently moved from [place], [place].->  Now lives in [place], [place]   Lives alone, but very close to her son Fara Olden and his wife.   Her daughter lives in [place], [place]   She has 3 grandchildren.   widow      She drinks coffee.     She does water aerobics/pool exercises.      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  Prior to moving to [place], she was the primary caregiver for her brother who passed away in 2017-07-01.  Once he died, her children convinced her to move up to [place] to be near them.      6 other siblings 2 1/2 siblings but those 1/2 siblings died in young age   Social Determinants of Health   Financial Resource Strain: Low Risk   . Difficulty of Paying Living Expenses: Not hard at all  Food Insecurity: No Food Insecurity  . Worried About Charity fundraiser in the Last Year: Never true  . Ran Out of Food in the Last Year: Never true  Transportation Needs: No Transportation Needs  . Lack of Transportation (Medical): No  . Lack of Transportation (Non-Medical): No  Physical Activity: Insufficiently Active  . Days of Exercise per Week: 4 days  . Minutes of Exercise per Session: 20 min  Stress: No Stress Concern Present  . Feeling of Stress : Not at all  Social Connections: Unknown  . Frequency of Communication with Friends and Family: More than three times a week  . Frequency of Social Gatherings with  Friends and Family: More than three times a week  . Attends Religious Services: Not on file  . Active Member of Clubs or Organizations: Not on  file  . Attends Archivist Meetings: Not on file  . Marital Status: Widowed    Tobacco Counseling Counseling given: Not Answered   Clinical Intake:  Pre-visit preparation completed: Yes           How often do you need to have someone help you when you read instructions, pamphlets, or other written materials from your doctor or pharmacy?: 1 - Never  Interpreter Needed?: No      Activities of Daily Living In your present state of health, do you have any difficulty performing the following activities: 04/08/2019  Hearing? N  Vision? N  Difficulty concentrating or making decisions? N  Walking or climbing stairs? N  Dressing or bathing? N  Doing errands, shopping? N  Preparing Food and eating ? N  Using the Toilet? N  In the past six months, have you accidently leaked urine? N  Do you have problems with loss of bowel control? N  Managing your Medications? N  Managing your Finances? N  Housekeeping or managing your Housekeeping? N  Some recent data might be hidden     Immunizations and Health Maintenance  There is no immunization history on file for this patient. Health Maintenance Due  Topic Date Due  . TETANUS/TDAP  07/13/1961  . PNA vac Low Risk Adult (1 of 2 - PCV13) 07/14/2007    Patient Care Team: McLean-Scocuzza, [name], MD as PCP - General (Internal Medicine) Leonie Man, MD as PCP - Cardiology (Cardiology)  Indicate any recent Medical Services you may have received from other than [facility] providers in the past year (date may be approximate).     Assessment:   This is a routine wellness examination for [place].  Nurse connected with patient 04/08/19 at 11:00 AM EST by a telephone enabled telemedicine application and verified that I am speaking with the correct person using two identifiers. Patient  stated full name and DOB. Patient gave permission to continue with virtual visit. Patient's location was at home and Nurse's location was at [place] office.   Patient is alert and oriented x3. Patient denies difficulty focusing or concentrating. Patient likes to read, play computer games, complete puzzles for brain stimulation.   Health Maintenance Due: -PNA, Zoster and Tdap vaccine- discussed; patient states these vaccines were completed in [place] before she moved to [facility].  Reports record sent to pcp.   See completed HM at the end of note.   Eye: Visual acuity not assessed. Virtual visit. Followed by their ophthalmologist every 6 months. Left eye cornea transplant.  Dental: Visits every 6 months.    Hearing: Demonstrates normal hearing during visit.  Safety:  Patient feels safe at home- yes Patient does have smoke detectors at home- yes Patient does wear sunscreen or protective clothing when in direct sunlight - yes Patient does wear seat belt when in a moving vehicle - yes Patient drives- yes Adequate lighting in walkways free from debris- yes Grab bars and handrails used as appropriate- yes Ambulates with an assistive device- no Cell phone on person when ambulating outside of the home- yes  Social: Alcohol intake - no  Smoking history- never   Smokers in home? none Illicit drug use? none  Medication: Taking as directed and without issues.  Pill box in use -yes  Self managed - yes   Covid-19: Precautions and sickness symptoms discussed. Wears mask, social distancing, hand hygiene as appropriate.   Activities of Daily Living Patient denies needing assistance with: household chores, feeding themselves,  getting from bed to chair, getting to the toilet, bathing/showering, dressing, managing money, or preparing meals.   Discussed the importance of a healthy diet, water intake and the benefits of aerobic exercise.  Educational material provided.  Physical activity- active  around the home. Squats. No routine.   Diet:  Regular;moderate Water: good intake Caffeine: 1 cup of coffee  Other Providers Patient Care Team: McLean-Scocuzza, [name], MD as PCP - General (Internal Medicine) Leonie Man, MD as PCP - Cardiology (Cardiology)  Hearing/Vision screen  Hearing Screening   125Hz  250Hz  500Hz  1000Hz  2000Hz  3000Hz  4000Hz  6000Hz  8000Hz   Right ear:           Left ear:           Comments: Patient is able to hear conversational tones without difficulty.  No issues reported.   Vision Screening Comments: Visual acuity not assessed. Virtual visit.  Followed by their ophthalmologist every 6 months.  Left eye cornea transplant.  Dietary issues and exercise activities discussed: Current Exercise Habits: Home exercise routine, Intensity: Moderate  Goals    . Follow up with Primary Care Provider     As needed      Depression Screen PHQ 2/9 Scores 04/08/2019 01/06/2019  PHQ - 2 Score 0 0    Fall Risk Fall Risk  04/08/2019 01/06/2019  Falls in the past year? 0 0  Follow up Falls evaluation completed -   Timed Get Up and Go Performed no, virtual visit  Cognitive Function:     6CIT Screen 04/08/2019  What Year? 0 points  What month? 0 points  What time? 0 points  Count back from 20 0 points  Months in reverse 0 points  Repeat phrase 0 points  Total Score 0    Screening Tests Health Maintenance  Topic Date Due  . TETANUS/TDAP  07/13/1961  . PNA vac Low Risk Adult (1 of 2 - PCV13) 07/14/2007  . INFLUENZA VACCINE  06/24/2019 (Originally 10/25/2018)  . DEXA SCAN  Completed     Plan:   Keep all routine maintenance appointments.   Follow up 05/07/19 @ 11:00  Medicare Attestation I have personally reviewed: The patient's medical and social history Their use of alcohol, tobacco or illicit drugs Their current medications and supplements The patient's functional ability including ADLs,fall risks, home safety risks, cognitive, and hearing and  visual impairment Diet and physical activities Evidence for depression   I have reviewed and discussed with patient certain preventive protocols, quality metrics, and best practice recommendations.      Varney Biles, LPN   [id number]

## 2019-04-08 NOTE — Patient Instructions (Addendum)
  Ms. Blakenship , Thank you for taking time to come for your Medicare Wellness Visit. I appreciate your ongoing commitment to your health goals. Please review the following plan we discussed and let me know if I can assist you in the future.   These are the goals we discussed: Goals    . Follow up with Primary Care Provider     As needed       This is a list of the screening recommended for you and due dates:  Health Maintenance  Topic Date Due  . Tetanus Vaccine  07/13/1961  . Pneumonia vaccines (1 of 2 - PCV13) 07/14/2007  . Flu Shot  06/24/2019*  . DEXA scan (bone density measurement)  Completed  *Topic was postponed. The date shown is not the original due date.

## 2019-04-29 ENCOUNTER — Telehealth: Payer: Self-pay | Admitting: *Deleted

## 2019-04-29 NOTE — Telephone Encounter (Signed)
The patient called in stating that she would like to switch to the [place] office due to transportation issues. She stated that she is very happy with Dr. Ellyn Hack but it is too far for her to drive and it is hard to get transportation.

## 2019-04-29 NOTE — Telephone Encounter (Signed)
I 100% understand.  I think I suggested to her that we do have an office in [place]. She was in the think about it, but I think it would be much more convenient for her.  Very hard to get back and forth.  Glenetta Hew, MD

## 2019-04-30 NOTE — Telephone Encounter (Signed)
Left a message for the patient to call back.  

## 2019-04-30 NOTE — Telephone Encounter (Signed)
The patient has been made aware that she has been set up with the [place] office.

## 2019-05-07 ENCOUNTER — Ambulatory Visit (INDEPENDENT_AMBULATORY_CARE_PROVIDER_SITE_OTHER): Payer: Medicare Other | Admitting: Internal Medicine

## 2019-05-07 ENCOUNTER — Telehealth: Payer: Self-pay | Admitting: Internal Medicine

## 2019-05-07 ENCOUNTER — Encounter: Payer: Self-pay | Admitting: Internal Medicine

## 2019-05-07 ENCOUNTER — Other Ambulatory Visit: Payer: Self-pay

## 2019-05-07 VITALS — BP 126/64 | HR 70 | Ht 61.0 in | Wt 142.0 lb

## 2019-05-07 DIAGNOSIS — R7303 Prediabetes: Secondary | ICD-10-CM

## 2019-05-07 DIAGNOSIS — E785 Hyperlipidemia, unspecified: Secondary | ICD-10-CM | POA: Diagnosis not present

## 2019-05-07 DIAGNOSIS — J4520 Mild intermittent asthma, uncomplicated: Secondary | ICD-10-CM

## 2019-05-07 DIAGNOSIS — E039 Hypothyroidism, unspecified: Secondary | ICD-10-CM

## 2019-05-07 DIAGNOSIS — I2510 Atherosclerotic heart disease of native coronary artery without angina pectoris: Secondary | ICD-10-CM

## 2019-05-07 DIAGNOSIS — I10 Essential (primary) hypertension: Secondary | ICD-10-CM

## 2019-05-07 DIAGNOSIS — Z1283 Encounter for screening for malignant neoplasm of skin: Secondary | ICD-10-CM

## 2019-05-07 DIAGNOSIS — M8000XD Age-related osteoporosis with current pathological fracture, unspecified site, subsequent encounter for fracture with routine healing: Secondary | ICD-10-CM

## 2019-05-07 DIAGNOSIS — J45909 Unspecified asthma, uncomplicated: Secondary | ICD-10-CM | POA: Insufficient documentation

## 2019-05-07 NOTE — Telephone Encounter (Signed)
Resume prolia  Needs approval helping with osteoporosis  Last injection 05/26/18    [place]

## 2019-05-07 NOTE — Patient Instructions (Signed)
COVID-19 Vaccine Information can be found at: [email or web address] For questions related to vaccine distribution or appointments, please email [email or web address] or call [phone].    

## 2019-05-07 NOTE — Progress Notes (Signed)
telephone Note  I connected with Amy Montes  on 05/07/19 at 11:20 AM EST by telephone and verified that I am speaking with the correct person using two identifiers.  Location patient: home Location provider:work or home office Persons participating in the virtual visit: patient, provider  I discussed the limitations of evaluation and management by telemedicine and the availability of in person appointments. The patient expressed understanding and agreed to proceed.   HPI: 1. Asthma as child but when going outside in cold air feeling sob with air and with exertion  Declines albuterol inhaler for now as in the past caused jittery will call back if needed also disc xopenex  2. Hypothyroidism on medication 88 mcg tolerate  3. HTN on losartan 50 mg qd coreg 12.5 mg bid BP at goal  4. Skin wants tbse sister h/o skin check and wants to see derm never has     ROS: See pertinent positives and negatives per HPI.  Past Medical History:  Diagnosis Date  . Asthma    childhood  . Chicken pox   . Chronic back pain    Said spontaneous thoracic and lumbar spine fractures secondary to falls.  . Colon polyps   . Coronary artery disease involving native coronary artery without angina pectoris 07/2016   In setting of inferior STEMI, cardiac cath showed occluded RCA.  Too small for PCI  . Essential hypertension   . Glaucoma   . 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  Injured her face. ->  Both aspirin and Plavix were stopped. ->  No residual findings  . Hearing loss   . 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 (no stents)   . Hyperlipidemia with target LDL less than 70    On rosuvastatin  . Hypothyroidism (acquired)    On Synthroid  . 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 plavix so this was stopped   . Multiple fracture    x 5 vertebrae   . Urinary incontinence   .  UTI (urinary tract infection)     Past Surgical History:  Procedure Laterality Date  . ABDOMINAL HYSTERECTOMY     2/2 endometriosis and DUB  . BLADDER REPAIR    . cataract surgery     b/l  . CESAREAN SECTION    . 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 Sabra Heck in [facility]  . CRANIOTOMY     SAH evaccuation  . RIGHT AND LEFT HEART CATH  07/2016   Inferior STEMI.  Culprit lesion mRPDA = was too small to address.  20% pLAD.  LCx-LPL normal.  Patent.  RCA ostial 30% RCA dominant.  Hazy plaque rupture and a small 1.5 mm PDA. --> LV Gram EF 50%, Inf HK. Increased LVEDP  . TONSILLECTOMY    . TRANSTHORACIC ECHOCARDIOGRAM  11/2015   Right is being within normal limits.    Family History  Problem Relation Age of Onset  . Other Mother        Old age  . Hearing loss Mother   . Cancer Father        colon died in 47  . CVA Father   . Lupus Sister   . Lung cancer Brother   . Cancer Brother        lung smoker  . Heart disease Maternal Grandmother   .  Dementia Sister   . Colon cancer Brother   . Cancer Brother        colon  . Cancer Son        testicular cancer dx age 89 now 3 as of 12/2018   . Cancer Sister        skin    SOCIAL HX:  Recently moved from [place], [place].->  Now lives in [place], [place] Lives alone, but very close to her son Fara Olden and his wife. Her daughter lives in [place], [place] She has 3 grandchildren. widow  She drinks coffee.   She does water aerobics/pool exercises.  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  Prior to moving to [place], she was the primary caregiver for her brother who passed away in Jun 18, 2017.  Once he died, her children convinced her to move up to [place] to be near them.  6 other siblings 2 1/2 siblings but those 1/2 siblings died in young age  Current Outpatient Medications:  .  aspirin 81 MG chewable tablet, Chew by mouth daily., Disp: , Rfl:  .  Calcium Carb-Cholecalciferol (CALCIUM 1000 + D PO), Take by mouth 2 (two) times  daily. , Disp: , Rfl:  .  carvedilol (COREG) 12.5 MG tablet, Take 1 tablet (12.5 mg total) by mouth 2 (two) times daily with a meal., Disp: 180 tablet, Rfl: 3 .  ESTROGENS CONJ SYNTHETIC B PO, Take by mouth., Disp: , Rfl:  .  levothyroxine (SYNTHROID) 88 MCG tablet, Take 1 tablet (88 mcg total) by mouth daily before breakfast., Disp: 90 tablet, Rfl: 3 .  losartan (COZAAR) 50 MG tablet, Take 1 tablet (50 mg total) by mouth 2 (two) times daily., Disp: 180 tablet, Rfl: 3 .  LOTEMAX 0.5 % ophthalmic suspension, 2 (two) times daily. , Disp: , Rfl:  .  Multiple Vitamins-Minerals (MULTIVITAMIN ADULTS PO), Take by mouth., Disp: , Rfl:  .  rosuvastatin (CRESTOR) 20 MG tablet, Take 1 tablet (20 mg total) by mouth daily., Disp: 90 tablet, Rfl: 3 .  VITAMIN D PO, Take 4,000 Int'l Units by mouth daily., Disp: , Rfl:   EXAM:  VITALS per patient if applicable:  GENERAL: alert, oriented, appears well and in no acute distress  PSYCH/NEURO: pleasant and cooperative, no obvious depression or anxiety, speech and thought processing grossly intact  ASSESSMENT AND PLAN:  Discussed the following assessment and plan:  Essential hypertension - Plan: Comprehensive metabolic panel, Lipid panel, CBC with Differential/Platelet Cont meds fasting labs 06/2019   Hyperlipidemia, unspecified hyperlipidemia type Cont meds   Coronary artery disease involving native coronary artery of native heart without angina pectoris Risk factor control   Hypothyroidism, unspecified type - Plan: TSH Cont meds   Osteoporosis with current pathological fracture with routine healing Was on prolia inj last 05/2018 needs to resume as DEXA with osteopenia but h/o osteoporosis and fractures   Prediabetes - Plan: Hemoglobin A1c  Mild intermittent asthma without complication Prn Albuterol inhaler   HM Declines flu shotand other vaccines prev  pna shot per pt had in 06-18-2014 ? Which one  Had zostervax at [facility] in [place] declines shingrix   Declines vaccines I.e covid  vx prev declined Tdap, prevnar   Mammogram 03/11/19 neg  Colonoscopy with h/o polyps in 06/18/2013 last age 62 did not do and may like to do stool kit in future   S/p hysterectomy with BSO 1989 DUB endometrosis out of age window  DEXA 03/11/19 osteopenia -DEXA 09/2015 T score -2.5 +compression fractures  T12 and L1-4  ? Had 06/12/16 cant read copy h/o osteopenia/porosis with comp. fxs need to get copy of dexa was on prolia last shot 04/2018   -prolia injection had 05/26/2018, 10/10/17, 04/01/17, 09/24/16  -she was also on forteo 08/07/16   Consider derm in future referral placed today   Former PCP Dr. Lubertha Basque in [place] [phone] (get copy of labs, vaccines, mammogram, colonoscopy, DEXA, imaging) release faxed -2nd request 01/30/2019   Reviewed records Dr. August Saucer I.e labs and Dr. Jimmye Norman  Compression fx T12, L1-4    Cards Dr. Glenetta Hew in [place]  Dr. Brigitte Pulse eye in [facility]  Endocrine in [facility] [phone] Eye surgery in [facility] Dr. Sabra Heck  -we discussed possible serious and likely etiologies, options for evaluation and workup, limitations of telemedicine visit vs in person visit, treatment, treatment risks and precautions. Pt prefers to treat via telemedicine empirically rather then risking or undertaking an in person visit at this moment. Patient agrees to seek prompt in person care if worsening, new symptoms arise, or if is not improving with treatment.   I discussed the assessment and treatment plan with the patient. The patient was provided an opportunity to ask questions and all were answered. The patient agreed with the plan and demonstrated an understanding of the instructions.   The patient was advised to call back or seek an in-person evaluation if the symptoms worsen or if the condition fails to improve as anticipated.  Time spent 15-20 minutes  Delorise Jackson, MD

## 2019-05-11 NOTE — Telephone Encounter (Signed)
Pharmacist, community for [facility] on [address]. Patient ID [id number]

## 2019-05-12 NOTE — Telephone Encounter (Signed)
Ok to schedule prolia injection  Call pt  thanks Amy Montes

## 2019-05-12 NOTE — Telephone Encounter (Signed)
Patient has been approved for Prolia OK to schedule.

## 2019-05-29 NOTE — Telephone Encounter (Signed)
Patient has been scheduled for Prolia 06/03/19

## 2019-06-03 ENCOUNTER — Ambulatory Visit (INDEPENDENT_AMBULATORY_CARE_PROVIDER_SITE_OTHER): Payer: Medicare Other | Admitting: *Deleted

## 2019-06-03 ENCOUNTER — Other Ambulatory Visit: Payer: Self-pay

## 2019-06-03 DIAGNOSIS — M8000XD Age-related osteoporosis with current pathological fracture, unspecified site, subsequent encounter for fracture with routine healing: Secondary | ICD-10-CM | POA: Diagnosis not present

## 2019-06-03 MED ORDER — DENOSUMAB 60 MG/ML ~~LOC~~ SOSY
60.0000 mg | PREFILLED_SYRINGE | Freq: Once | SUBCUTANEOUS | Status: AC
  Administered 2019-06-03: 60 mg via SUBCUTANEOUS

## 2019-06-03 NOTE — Progress Notes (Signed)
Patient presented for Prolia injection to Left arm [place], patient voiced no concerns or complaints during or after injection. 

## 2019-07-02 ENCOUNTER — Ambulatory Visit (INDEPENDENT_AMBULATORY_CARE_PROVIDER_SITE_OTHER): Payer: Medicare Other | Admitting: Cardiovascular Disease

## 2019-07-02 ENCOUNTER — Other Ambulatory Visit: Payer: Self-pay

## 2019-07-02 ENCOUNTER — Encounter: Payer: Self-pay | Admitting: Cardiovascular Disease

## 2019-07-02 VITALS — BP 170/90 | HR 65 | Ht 61.0 in | Wt 150.1 lb

## 2019-07-02 DIAGNOSIS — R0602 Shortness of breath: Secondary | ICD-10-CM | POA: Diagnosis not present

## 2019-07-02 DIAGNOSIS — I2510 Atherosclerotic heart disease of native coronary artery without angina pectoris: Secondary | ICD-10-CM | POA: Diagnosis not present

## 2019-07-02 DIAGNOSIS — E785 Hyperlipidemia, unspecified: Secondary | ICD-10-CM

## 2019-07-02 DIAGNOSIS — I10 Essential (primary) hypertension: Secondary | ICD-10-CM

## 2019-07-02 NOTE — Progress Notes (Signed)
Cardiology Office Note   Date:  07/02/2019   ID:  Amy Montes, DOB 1941-03-08, MRN [id number]  PCP:  McLean-Scocuzza, [name], MD  Cardiologist:   Kathlyn Sacramento, MD   Chief Complaint  Patient presents with  . office visit    6 month F/U-Patient reports LE edema; Meds verbally reviewed with patient.      History of Present Illness: Amy Montes is a 79 y.o. female who presents for a follow-up visit regarding coronary artery disease.  Her initial care was in [place] but she saw Dr. Ellyn Hack in October.  She lives in [place]. She was hospitalized in May 2018 with inferior ST elevation myocardial infarction.  Cardiac catheterization showed plaque rupture in the right PDA which was felt to be too small for angioplasty.  EF was 50% with inferior wall hypokinesis.  LVEDP was mildly elevated at 26 mmHg.  She was treated medically. Cath report at that time showed 20% proximal LAD stenosis, 30% ostial RCA stenosis and ruptured plaque in a small right PDA.  Echo in 2017 showed normal LV systolic function. 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  She has been doing well with no recent chest pain.  However, she describes worsening exertional dyspnea and leg edema that started recently.  She takes her medications regularly.  Her blood pressure is elevated today but I reviewed her home blood pressure readings and the highest blood pressure reading was 143/86 with blood pressure in the 123456 mmHg systolic most of the time.   Past Medical History:  Diagnosis Date  . Asthma    childhood  . Chicken pox   . Chronic back pain    Said spontaneous thoracic and lumbar spine fractures secondary to falls.  . Colon polyps   . Coronary artery disease involving native coronary artery without angina pectoris 07/2016   In setting of inferior STEMI, cardiac cath showed occluded RCA.  Too small for PCI  . Essential hypertension   . Glaucoma   . H/O traumatic subdural  hematoma 03/2016   She fell while in [place] visiting.  Injured her face. ->  Both aspirin and Plavix were stopped. ->  No residual findings  . Hearing loss   . 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 (no stents)   . Hyperlipidemia with target LDL less than 70    On rosuvastatin  . Hypothyroidism (acquired)    On Synthroid  . Intracranial hemorrhage ([place])    s/p fall years ago when on ? plavix so this was stopped   . Multiple fracture    x 5 vertebrae   . Urinary incontinence   . UTI (urinary tract infection)     Past Surgical History:  Procedure Laterality Date  . ABDOMINAL HYSTERECTOMY     2/2 endometriosis and DUB  . BLADDER REPAIR    . cataract surgery     b/l  . CESAREAN SECTION    . 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 Sabra Heck in [facility]  . CRANIOTOMY     SAH evaccuation  . RIGHT AND LEFT HEART CATH  07/2016   Inferior STEMI.  Culprit lesion mRPDA = was too small to address.  20% pLAD.  LCx-LPL normal.  Patent.  RCA ostial 30% RCA dominant.  Hazy plaque rupture and a small 1.5 mm PDA. --> LV Gram EF 50%, Inf HK. Increased LVEDP  .  TONSILLECTOMY    . TRANSTHORACIC ECHOCARDIOGRAM  11/2015   Right is being within normal limits.     Current Outpatient Medications  Medication Sig Dispense Refill  . aspirin 81 MG chewable tablet Chew by mouth daily.    . carvedilol (COREG) 12.5 MG tablet Take 1 tablet (12.5 mg total) by mouth 2 (two) times daily with a meal. 180 tablet 3  . ESTROGENS CONJ SYNTHETIC B PO Take by mouth daily.     Marland Kitchen levothyroxine (SYNTHROID) 88 MCG tablet Take 1 tablet (88 mcg total) by mouth daily before breakfast. 90 tablet 3  . losartan (COZAAR) 50 MG tablet Take 1 tablet (50 mg total) by mouth 2 (two) times daily. 180 tablet 3  . LOTEMAX 0.5 % ophthalmic suspension 2 (two) times daily.     . Multiple Vitamins-Minerals (MULTIVITAMIN ADULTS PO) Take by mouth daily.       . rosuvastatin (CRESTOR) 20 MG tablet Take 1 tablet (20 mg total) by mouth daily. 90 tablet 3  . zinc gluconate 50 MG tablet Take 50 mg by mouth daily.    . Calcium Carb-Cholecalciferol (CALCIUM 1000 + D PO) Take by mouth 2 (two) times daily.     Marland Kitchen VITAMIN D PO Take 4,000 Int'l Units by mouth daily.     No current facility-administered medications for this visit.    Allergies:   Chocolate and Lactose intolerance (gi)    Social History:  The patient  reports that she has never smoked. She has never used smokeless tobacco. She reports that she does not drink alcohol or use drugs.   Family History:  The patient's family history includes CVA in her father; Cancer in her brother, brother, father, sister, and son; Colon cancer in her brother; Dementia in her sister; Hearing loss in her mother; Heart disease in her maternal grandmother; Lung cancer in her brother; Lupus in her sister; Other in her mother.    ROS:  Please see the history of present illness.   Otherwise, review of systems are positive for none.   All other systems are reviewed and negative.    PHYSICAL EXAM: VS:  BP (!) 170/90 (BP Location: Left Arm, Patient Position: Sitting, Cuff Size: Normal)   Pulse 65   Ht 5\' 1"  (1.549 m)   Wt 150 lb 2 oz (68.1 kg)   SpO2 95%   BMI 28.37 kg/m  , BMI Body mass index is 28.37 kg/m. GEN: Well nourished, well developed, in no acute distress  HEENT: normal  Neck: no JVD, carotid bruits, or masses Cardiac: RRR; no murmurs, rubs, or gallops,no edema  Respiratory:  clear to auscultation bilaterally, normal work of breathing GI: soft, nontender, nondistended, + BS MS: no deformity or atrophy  Skin: warm and dry, no rash Neuro:  Strength and sensation are intact Psych: euthymic mood, full affect   EKG:  EKG is ordered today. The ekg ordered today demonstrates normal sinus rhythm with no significant ST or T wave changes.   Recent Labs: 01/16/2019: ALT 16; BUN 17; Creatinine, Ser 0.97;  Hemoglobin 13.3; Platelets 224.0; Potassium 4.2; Sodium 138; TSH 3.94    Lipid Panel    Component Value Date/Time   CHOL 134 01/16/2019 0802   TRIG 83.0 01/16/2019 0802   HDL 56.20 01/16/2019 0802   CHOLHDL 2 01/16/2019 0802   VLDL 16.6 01/16/2019 0802   LDLCALC 61 01/16/2019 0802      Wt Readings from Last 3 Encounters:  07/02/19 150 lb 2 oz (68.1 kg)  05/07/19 142 lb (64.4 kg)  04/08/19 142 lb (64.4 kg)       No flowsheet data found.    ASSESSMENT AND PLAN:  1.  Coronary artery disease involving native coronary arteries without angina: She is doing reasonably well overall but reports worsening exertional dyspnea and recent leg edema.  She has no chest pain.  I do not think she is having angina.  I am going to obtain an echocardiogram to evaluate LV systolic and diastolic function.  2.  Hyperlipidemia: Continue treatment with rosuvastatin.  Most recent lipid profile showed an LDL of 61 and triglyceride of 83.  3.  Essential hypertension: Blood pressure is elevated today but normally her blood pressure is controlled and home blood pressure readings look good.  Possible component of whitecoat syndrome.  Continue to monitor for now.   Disposition:   FU with me in 6 months  Signed,  Kathlyn Sacramento, MD  07/02/2019 3:15 PM    [place]

## 2019-07-02 NOTE — Patient Instructions (Signed)
Medication Instructions:  Your physician recommends that you continue on your current medications as directed. Please refer to the Current Medication list given to you today. *If you need a refill on your cardiac medications before your next appointment, please call your pharmacy*   Lab Work: None ordered  If you have labs (blood work) drawn today and your tests are completely normal, you will receive your results only by: Marland Kitchen MyChart Message (if you have MyChart) OR . A paper copy in the mail If you have any lab test that is abnormal or we need to change your treatment, we will call you to review the results.   Testing/Procedures: Your physician has requested that you have an echocardiogram. Echocardiography is a painless test that uses sound waves to create images of your heart. It provides your doctor with information about the size and shape of your heart and how well your heart's chambers and valves are working. This procedure takes approximately one hour. There are no restrictions for this procedure.     Follow-Up: At [facility], you and your health needs are our priority.  As part of our continuing mission to provide you with exceptional heart care, we have created designated Provider Care Teams.  These Care Teams include your primary Cardiologist (physician) and Advanced Practice Providers (APPs -  Physician Assistants and Nurse Practitioners) who all work together to provide you with the care you need, when you need it.  We recommend signing up for the patient portal called "MyChart".  Sign up information is provided on this After Visit Summary.  MyChart is used to connect with patients for Virtual Visits (Telemedicine).  Patients are able to view lab/test results, encounter notes, upcoming appointments, etc.  Non-urgent messages can be sent to your provider as well.   To learn more about what you can do with MyChart, go to [email or web address].    Your next appointment:   6  month(s)  The format for your next appointment:   In Person  Provider:    You may see Dr. Fletcher Anon or one of the following Advanced Practice Providers on your designated Care Team:    Murray Hodgkins, NP  Christell Faith, PA-C  Marrianne Mood, PA-C    Other Instructions  Echocardiogram An echocardiogram is a procedure that uses painless sound waves (ultrasound) to produce an image of the heart. Images from an echocardiogram can provide important information about:  Signs of coronary artery disease (CAD).  Aneurysm detection. An aneurysm is a weak or damaged part of an artery wall that bulges out from the normal force of blood pumping through the body.  Heart size and shape. Changes in the size or shape of the heart can be associated with certain conditions, including heart failure, aneurysm, and CAD.  Heart muscle function.  Heart valve function.  Signs of a past heart attack.  Fluid buildup around the heart.  Thickening of the heart muscle.  A tumor or infectious growth around the heart valves. Tell a health care provider about:  Any allergies you have.  All medicines you are taking, including vitamins, herbs, eye drops, creams, and over-the-counter medicines.  Any blood disorders you have.  Any surgeries you have had.  Any medical conditions you have.  Whether you are pregnant or may be pregnant. What are the risks? Generally, this is a safe procedure. However, problems may occur, including:  Allergic reaction to dye (contrast) that may be used during the procedure. What happens before the procedure? No specific  preparation is needed. You may eat and drink normally. What happens during the procedure?   An IV tube may be inserted into one of your veins.  You may receive contrast through this tube. A contrast is an injection that improves the quality of the pictures from your heart.  A gel will be applied to your chest.  A wand-like tool (transducer) will  be moved over your chest. The gel will help to transmit the sound waves from the transducer.  The sound waves will harmlessly bounce off of your heart to allow the heart images to be captured in real-time motion. The images will be recorded on a computer. The procedure may vary among health care providers and hospitals. What happens after the procedure?  You may return to your normal, everyday life, including diet, activities, and medicines, unless your health care provider tells you not to do that. Summary  An echocardiogram is a procedure that uses painless sound waves (ultrasound) to produce an image of the heart.  Images from an echocardiogram can provide important information about the size and shape of your heart, heart muscle function, heart valve function, and fluid buildup around your heart.  You do not need to do anything to prepare before this procedure. You may eat and drink normally.  After the echocardiogram is completed, you may return to your normal, everyday life, unless your health care provider tells you not to do that. This information is not intended to replace advice given to you by your health care provider. Make sure you discuss any questions you have with your health care provider. Document Revised: 07/03/2018 Document Reviewed: 04/14/2016 Elsevier Patient Education  [place].

## 2019-07-17 ENCOUNTER — Other Ambulatory Visit: Payer: Medicare Other

## 2019-07-28 ENCOUNTER — Ambulatory Visit: Payer: Medicare Other | Admitting: Dermatology

## 2019-08-03 ENCOUNTER — Other Ambulatory Visit (INDEPENDENT_AMBULATORY_CARE_PROVIDER_SITE_OTHER): Payer: Medicare Other

## 2019-08-03 ENCOUNTER — Other Ambulatory Visit: Payer: Self-pay

## 2019-08-03 DIAGNOSIS — I10 Essential (primary) hypertension: Secondary | ICD-10-CM

## 2019-08-03 DIAGNOSIS — E039 Hypothyroidism, unspecified: Secondary | ICD-10-CM

## 2019-08-03 DIAGNOSIS — R7303 Prediabetes: Secondary | ICD-10-CM | POA: Diagnosis not present

## 2019-08-03 LAB — CBC WITH DIFFERENTIAL/PLATELET
Basophils Absolute: 0 10*3/uL (ref 0.0–0.1)
Basophils Relative: 0.6 % (ref 0.0–3.0)
Eosinophils Absolute: 0.1 10*3/uL (ref 0.0–0.7)
Eosinophils Relative: 2.9 % (ref 0.0–5.0)
HCT: 39.6 % (ref 36.0–46.0)
Hemoglobin: 13.3 g/dL (ref 12.0–15.0)
Lymphocytes Relative: 24.5 % (ref 12.0–46.0)
Lymphs Abs: 1.3 10*3/uL (ref 0.7–4.0)
MCHC: 33.5 g/dL (ref 30.0–36.0)
MCV: 97.3 fl (ref 78.0–100.0)
Monocytes Absolute: 0.6 10*3/uL (ref 0.1–1.0)
Monocytes Relative: 11.3 % (ref 3.0–12.0)
Neutro Abs: 3.1 10*3/uL (ref 1.4–7.7)
Neutrophils Relative %: 60.7 % (ref 43.0–77.0)
Platelets: 185 10*3/uL (ref 150.0–400.0)
RBC: 4.07 Mil/uL (ref 3.87–5.11)
RDW: 13.3 % (ref 11.5–15.5)
WBC: 5.1 10*3/uL (ref 4.0–10.5)

## 2019-08-03 LAB — COMPREHENSIVE METABOLIC PANEL
ALT: 14 U/L (ref 0–35)
AST: 20 U/L (ref 0–37)
Albumin: 3.9 g/dL (ref 3.5–5.2)
Alkaline Phosphatase: 69 U/L (ref 39–117)
BUN: 23 mg/dL (ref 6–23)
CO2: 30 mEq/L (ref 19–32)
Calcium: 9 mg/dL (ref 8.4–10.5)
Chloride: 105 mEq/L (ref 96–112)
Creatinine, Ser: 0.94 mg/dL (ref 0.40–1.20)
GFR: 57.73 mL/min — ABNORMAL LOW (ref >60.00)
Glucose, Bld: 110 mg/dL — ABNORMAL HIGH (ref 70–99)
Potassium: 4.2 mEq/L (ref 3.5–5.1)
Sodium: 139 mEq/L (ref 135–145)
Total Bilirubin: 0.5 mg/dL (ref 0.2–1.2)
Total Protein: 7.4 g/dL (ref 6.0–8.3)

## 2019-08-03 LAB — LIPID PANEL
Cholesterol: 124 mg/dL (ref 0–200)
HDL: 49.1 mg/dL (ref >39.00)
LDL Cholesterol: 60 mg/dL (ref 0–99)
NonHDL: 75.31
Total CHOL/HDL Ratio: 3
Triglycerides: 76 mg/dL (ref 0.0–149.0)
VLDL: 15.2 mg/dL (ref 0.0–40.0)

## 2019-08-03 LAB — HEMOGLOBIN A1C: Hgb A1c MFr Bld: 6 % (ref 4.6–6.5)

## 2019-08-03 LAB — TSH: TSH: 2.13 u[IU]/mL (ref 0.35–4.50)

## 2019-08-05 ENCOUNTER — Telehealth: Payer: Self-pay | Admitting: Internal Medicine

## 2019-08-05 NOTE — Telephone Encounter (Signed)
Patient informed and verbalized understanding.   No further questions at this time  

## 2019-08-05 NOTE — Telephone Encounter (Signed)
-----   Message from Delorise Jackson, MD sent at 08/04/2019  8:10 AM EDT ----- Cholesterol normal Liver #s normal  Kidneys stable  Thyroid lab normal  Blood cts normal  A1C=prediabetes rec healthy diet and exercise

## 2019-08-05 NOTE — Telephone Encounter (Signed)
Pt called about lab results. Please advise

## 2019-08-06 ENCOUNTER — Other Ambulatory Visit: Payer: Medicare Other

## 2019-08-31 ENCOUNTER — Other Ambulatory Visit: Payer: Self-pay

## 2019-08-31 ENCOUNTER — Ambulatory Visit (INDEPENDENT_AMBULATORY_CARE_PROVIDER_SITE_OTHER): Payer: Medicare Other

## 2019-08-31 DIAGNOSIS — R0602 Shortness of breath: Secondary | ICD-10-CM

## 2019-11-06 ENCOUNTER — Telehealth (INDEPENDENT_AMBULATORY_CARE_PROVIDER_SITE_OTHER): Payer: Medicare Other | Admitting: Internal Medicine

## 2019-11-06 ENCOUNTER — Encounter: Payer: Self-pay | Admitting: Internal Medicine

## 2019-11-06 ENCOUNTER — Other Ambulatory Visit: Payer: Self-pay

## 2019-11-06 VITALS — BP 113/65 | HR 75 | Ht 61.0 in | Wt 145.0 lb

## 2019-11-06 DIAGNOSIS — R194 Change in bowel habit: Secondary | ICD-10-CM

## 2019-11-06 DIAGNOSIS — R197 Diarrhea, unspecified: Secondary | ICD-10-CM | POA: Diagnosis not present

## 2019-11-06 DIAGNOSIS — R1084 Generalized abdominal pain: Secondary | ICD-10-CM | POA: Diagnosis not present

## 2019-11-06 DIAGNOSIS — K5900 Constipation, unspecified: Secondary | ICD-10-CM

## 2019-11-06 DIAGNOSIS — Z20822 Contact with and (suspected) exposure to covid-19: Secondary | ICD-10-CM

## 2019-11-06 NOTE — Progress Notes (Signed)
Telephone Note  I connected with Harrington Challenger  on 11/06/19 at  9:15 AM EDT by telephone and verified that I am speaking with the correct person using two identifiers.  Location patient: home Location provider:work or home office Persons participating in the virtual visit: patient, provider  I discussed the limitations of evaluation and management by telemedicine and the availability of in person appointments. The patient expressed understanding and agreed to proceed.   HPI: 1. Exposure to GD who tested + and grand daughter off quarantine 10/28/19. Pt had been around grandaughter outside prior to covid testing +  2. X 1 month daily change in bowel habits, constipation, diarrhea, ab pain new with bowel movements and upper abdominal pain intermittently. She has had problems like this for years as well Patient presenting with pain in the upper right side of her chest. States that months ago she had imagine done that was normal.  States the pain is happening more frequently. Patient also now having lower GI problems. States she has a lot of discomfort after having a bowel movement and is painful. States most of her stools are loose (tries Immodium) followed by occasional constipation. States she does not take laxatives or stool softeners.   Patient's granddaughter had tested positive and has since quarantined. Patient had seen her before she  Tested positive and self quarantined. Has not been tested.   ROS: See pertinent positives and negatives per HPI.  Past Medical History:  Diagnosis Date  . Asthma    childhood  . Chicken pox   . Chronic back pain    Said spontaneous thoracic and lumbar spine fractures secondary to falls.  . Colon polyps   . Coronary artery disease involving native coronary artery without angina pectoris 07/2016   In setting of inferior STEMI, cardiac cath showed occluded RCA.  Too small for PCI  . Essential hypertension   . Glaucoma   . H/O traumatic subdural  hematoma 03/2016   She fell while in [place] visiting.  Injured her face. ->  Both aspirin and Plavix were stopped. ->  No residual findings  . Hearing loss   . 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 (no stents)   . Hyperlipidemia with target LDL less than 70    On rosuvastatin  . Hypothyroidism (acquired)    On Synthroid  . 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 plavix so this was stopped   . Multiple fracture    x 5 vertebrae   . Urinary incontinence   . UTI (urinary tract infection)     Past Surgical History:  Procedure Laterality Date  . ABDOMINAL HYSTERECTOMY     2/2 endometriosis and DUB  . BLADDER REPAIR    . cataract surgery     b/l  . CESAREAN SECTION    . 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 Sabra [facility]  . CRANIOTOMY     SAH evaccuation  . RIGHT AND LEFT HEART CATH  07/2016   Inferior STEMI.  Culprit lesion mRPDA = was too small to address.  20% pLAD.  LCx-LPL normal.  Patent.  RCA ostial 30% RCA dominant.  Hazy plaque rupture and a small 1.5 mm PDA. --> LV Gram EF 50%, Inf HK. Increased LVEDP  . TONSILLECTOMY    . TRANSTHORACIC ECHOCARDIOGRAM  11/2015   Right is being within normal limits.  Family History  Problem Relation Age of Onset  . Other Mother        Old age  . Hearing loss Mother   . Cancer Father        colon died in 58  . CVA Father   . Lupus Sister   . Lung cancer Brother   . Cancer Brother        lung smoker  . Heart disease Maternal Grandmother   . Dementia Sister   . Colon cancer Brother   . Cancer Brother        colon  . Cancer Son        testicular cancer dx age 51 now 66 as of 12/2018   . Cancer Sister        skin    SOCIAL HX: lives at home   Current Outpatient Medications:  .  aspirin 81 MG chewable tablet, Chew by mouth daily., Disp: , Rfl:  .  carvedilol (COREG) 12.5 MG tablet, Take 1 tablet (12.5 mg  total) by mouth 2 (two) times daily with a meal., Disp: 180 tablet, Rfl: 3 .  ESTROGENS CONJ SYNTHETIC B PO, Take by mouth daily. , Disp: , Rfl:  .  levothyroxine (SYNTHROID) 88 MCG tablet, Take 1 tablet (88 mcg total) by mouth daily before breakfast., Disp: 90 tablet, Rfl: 3 .  losartan (COZAAR) 50 MG tablet, Take 1 tablet (50 mg total) by mouth 2 (two) times daily., Disp: 180 tablet, Rfl: 3 .  LOTEMAX 0.5 % ophthalmic suspension, 2 (two) times daily. , Disp: , Rfl:  .  rosuvastatin (CRESTOR) 20 MG tablet, Take 1 tablet (20 mg total) by mouth daily., Disp: 90 tablet, Rfl: 3 .  Calcium Carb-Cholecalciferol (CALCIUM 1000 + D PO), Take by mouth 2 (two) times daily.  (Patient not taking: Reported on 11/06/2019), Disp: , Rfl:  .  Multiple Vitamins-Minerals (MULTIVITAMIN ADULTS PO), Take by mouth daily.  (Patient not taking: Reported on 11/06/2019), Disp: , Rfl:  .  VITAMIN D PO, Take 4,000 Int'l Units by mouth daily. (Patient not taking: Reported on 11/06/2019), Disp: , Rfl:  .  zinc gluconate 50 MG tablet, Take 50 mg by mouth daily. 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 Anastasia Pall immodium prn  Generalized abdominal pain - Plan: Ambulatory referral to Gastroenterology  Constipation, unspecified constipation type - Plan: Ambulatory referral to Gastroenterology  Diarrhea, unspecified type - Plan: Ambulatory referral to Gastroenterology  Exposure to COVID-19 virus Off quarantine and granddaughter of of quarantine 10/28/19 who GD is the one who tested +   HM Declines flu shotand other  vaccinesprev J&J x 1  pna shot per pt had in 2016 ? Which one  Had zostervax at [facility] in [place] declines shingrix  Declines vaccines I.e covid  vx prev declined Tdap, prevnar   Mammogram12/16/20 neg  Colonoscopywith h/o polypsin 12 last age 83 did not do and may like to do stool kit in future  S/p hysterectomywith [phone] DUB endometrosis out of age window  DEXA 03/11/19 osteopenia -DEXA 09/2015 T score -2.5 +compression fractures T12 and L1-4 ? Had 06/12/16 cant read copy h/o  osteopenia/porosis with comp. fxsneed to get copy of dexa was on prolia last shot 04/2018  -prolia injection had 05/26/2018, 10/10/17, 04/01/17, 09/24/16  -she was also on forteo 08/07/16   Consider derm in future referral placed today   Former PCP Dr. Lubertha Basque in [place] [phone] (get copy of labs, vaccines, mammogram, colonoscopy, DEXA, imaging) release faxed -2nd request 01/30/2019  Reviewed records Dr. August Saucer I.e labs and Dr. Jimmye Norman  Compression fx T12, L1-4   Cards Dr. Glenetta Hew in [place]  Dr. Brigitte Pulse eye in [place]  Endocrine in [facility] [phone] Eye surgery in [facility] Dr. Sabra Heck  -we discussed possible serious and likely etiologies, options for evaluation and workup, limitations of telemedicine visit vs in person visit, treatment, treatment risks and precautions. Pt prefers to treat via telemedicine empirically rather then risking or undertaking an in person visit at this moment. Patient agrees to seek prompt in person care if worsening, new symptoms arise, or if is not improving with treatment.   I discussed the assessment and treatment plan with the patient. The patient was provided an opportunity to ask questions and all were answered. The patient agreed with the plan and demonstrated an understanding of the instructions.   The patient was advised to call back or seek an in-person evaluation if the symptoms worsen or if the condition fails to improve as  anticipated.  Time 20 minutes Delorise Jackson, MD

## 2019-11-06 NOTE — Progress Notes (Signed)
Patient presenting with pain in the upper right side of her chest. States that months ago she had imagine done that was normal.  States the pain is happening more frequently. Patient also now having lower GI problems. States she has a lot of discomfort after having a bowel movement and is painful. States most of her stools are loose followed by occasional constipation. States she does not take laxatives or stool softeners.   Patient's granddaughter had tested positive and has since quarantined. Patient had seen her before she  Tested positive and self quarantined. Has not been tested.

## 2019-11-10 ENCOUNTER — Encounter: Payer: Self-pay | Admitting: Dermatology

## 2019-11-10 ENCOUNTER — Ambulatory Visit (INDEPENDENT_AMBULATORY_CARE_PROVIDER_SITE_OTHER): Payer: Medicare Other | Admitting: Dermatology

## 2019-11-10 ENCOUNTER — Other Ambulatory Visit: Payer: Self-pay

## 2019-11-10 DIAGNOSIS — S90562A Insect bite (nonvenomous), left ankle, initial encounter: Secondary | ICD-10-CM

## 2019-11-10 DIAGNOSIS — D1800 Hemangioma unspecified site: Secondary | ICD-10-CM

## 2019-11-10 DIAGNOSIS — I2510 Atherosclerotic heart disease of native coronary artery without angina pectoris: Secondary | ICD-10-CM

## 2019-11-10 DIAGNOSIS — S90561A Insect bite (nonvenomous), right ankle, initial encounter: Secondary | ICD-10-CM | POA: Diagnosis not present

## 2019-11-10 DIAGNOSIS — L814 Other melanin hyperpigmentation: Secondary | ICD-10-CM

## 2019-11-10 DIAGNOSIS — L578 Other skin changes due to chronic exposure to nonionizing radiation: Secondary | ICD-10-CM

## 2019-11-10 DIAGNOSIS — D229 Melanocytic nevi, unspecified: Secondary | ICD-10-CM

## 2019-11-10 DIAGNOSIS — L918 Other hypertrophic disorders of the skin: Secondary | ICD-10-CM

## 2019-11-10 DIAGNOSIS — T23072A Burn of unspecified degree of left wrist, initial encounter: Secondary | ICD-10-CM

## 2019-11-10 DIAGNOSIS — L821 Other seborrheic keratosis: Secondary | ICD-10-CM

## 2019-11-10 DIAGNOSIS — W57XXXA Bitten or stung by nonvenomous insect and other nonvenomous arthropods, initial encounter: Secondary | ICD-10-CM

## 2019-11-10 DIAGNOSIS — T300 Burn of unspecified body region, unspecified degree: Secondary | ICD-10-CM

## 2019-11-10 DIAGNOSIS — Z808 Family history of malignant neoplasm of other organs or systems: Secondary | ICD-10-CM

## 2019-11-10 NOTE — Progress Notes (Signed)
   New Patient Visit  Subjective  Amy Montes is a 79 y.o. female who presents for the following: New Patient (Initial Visit).  Patient presents today as new patient to establish care with practice, does have an area of concern on her back that gets irritated by bra. Patient states that she does not have a h/o skin cancer. No other new or changing skin lesions. Sister has h/o multiple skin cancers.  The following portions of the chart were reviewed this encounter and updated as appropriate:      Review of Systems:  No other skin or systemic complaints except as noted in HPI or Assessment and Plan.  Objective  Well appearing patient in no apparent distress; mood and affect are within normal limits.  A full examination was performed including scalp, head, eyes, ears, nose, lips, neck, chest, axillae, abdomen, back, buttocks, bilateral upper extremities, bilateral lower extremities, hands, feet, fingers, toes, fingernails, and toenails. All findings within normal limits unless otherwise noted below.  Objective  Spinal mid back: 2 mm  Fleshy, skin-colored pedunculated papule.    Objective  Left Wrist - Anterior: Linear crusted healing erosion- burn from cooking  Objective  Bilateral Ankles: Pink excoriated papules   Assessment & Plan    Skin tag Spinal mid back  Symptomatic  Epidermal / dermal shaving - Spinal mid back  Lesion diameter (cm):  0.2 Informed consent: discussed and consent obtained   Anesthesia: the lesion was anesthetized in a standard fashion   Anesthetic:  1% lidocaine w/ epinephrine 1-100,000 buffered w/ 8.4% NaHCO3 Instrument used: scissors   Hemostasis achieved with: pressure, aluminum chloride and electrodesiccation   Outcome: patient tolerated procedure well   Post-procedure details: wound care instructions given   Post-procedure details comment:  Ointment and bandage applied Additional details:  Skin tag removal today with snip  excision  Burn Left Wrist - Anterior  Start OTC antibiotic ointment or Vaseline and cover til healed Sunscreen once healed  Bug bite, initial encounter Bilateral Ankles  Benign, observe OTC HC cream prn itch   Lentigines - Scattered tan macules - Discussed due to sun exposure - Benign, observe - Call for any changes  Seborrheic Keratoses - Stuck-on, waxy, tan-brown papules and plaques  - Discussed benign etiology and prognosis. - Observe - Call for any changes  Melanocytic Nevi - Tan-brown and/or pink-flesh-colored symmetric macules and papules - Benign appearing on exam today - Observation - Call clinic for new or changing moles - Recommend daily use of broad spectrum spf 30+ sunscreen to sun-exposed areas.   Hemangiomas - Red papules - Discussed benign nature - Observe - Call for any changes  Actinic Damage - diffuse scaly erythematous macules with underlying dyspigmentation - Recommend daily broad spectrum sunscreen SPF 30+ to sun-exposed areas, reapply every 2 hours as needed.  - Call for new or changing lesions.   Return if symptoms worsen or fail to improve.  Marene Lenz, CMA, am acting as scribe for Brendolyn Patty, MD .  Documentation: I have reviewed the above documentation for accuracy and completeness, and I agree with the above.  Brendolyn Patty MD

## 2019-11-10 NOTE — Patient Instructions (Addendum)

## 2019-12-02 ENCOUNTER — Other Ambulatory Visit: Payer: Self-pay

## 2019-12-02 ENCOUNTER — Ambulatory Visit (INDEPENDENT_AMBULATORY_CARE_PROVIDER_SITE_OTHER): Payer: Medicare Other | Admitting: Gastroenterology

## 2019-12-02 ENCOUNTER — Encounter: Payer: Self-pay | Admitting: Gastroenterology

## 2019-12-02 VITALS — BP 180/84 | HR 72 | Temp 98.1°F | Wt 146.4 lb

## 2019-12-02 DIAGNOSIS — R194 Change in bowel habit: Secondary | ICD-10-CM

## 2019-12-02 DIAGNOSIS — R197 Diarrhea, unspecified: Secondary | ICD-10-CM

## 2019-12-02 DIAGNOSIS — I2510 Atherosclerotic heart disease of native coronary artery without angina pectoris: Secondary | ICD-10-CM | POA: Diagnosis not present

## 2019-12-02 MED ORDER — NA SULFATE-K SULFATE-MG SULF 17.5-3.13-1.6 GM/177ML PO SOLN: 354.0000 mL | Freq: Once | ORAL | 0 refills | Status: AC

## 2019-12-02 NOTE — Progress Notes (Signed)
Amy Bellows MD, MRCP(U.K) [address]  [place]  [place], [place] [address]  Main: [phone]  Fax: [phone]   Gastroenterology Consultation  Referring Provider:     McLean-Scocuzza, Amy Mackie * Primary Care Physician:  Montes, Amy Glow, MD Primary Gastroenterologist:  Dr. Jonathon Montes  Reason for Consultation:     Change in bowel habits, abdominal pain         HPI:   Amy Montes is a 79 y.o. y/o female referred for consultation & management  by Dr. Terese Door, Amy Glow, MD.    She has been referred for change in bowel habits, constipation, diarrhea and abdominal pain.  In May 2021 hemoglobin was 13.3 g CMP was normal except for elevated glucose level.  She has moved from [place] a couple of months back.  He recollects that she has had right-sided abdominal pain for many months and probably over a year.  No clear aggravating or relieving factors except when she leans forward.  The pain is localized feels like a squeeze no better with a bowel movement no worse with eating.  Denies any NSAID use.  Last colonoscopy was 7 years back which was normal.  She had a recent ultrasound of her right upper quadrant and abdomen in October 2020 which showed no abnormalities.  She has very loose stools daily.  Feels like water.  Denies any blood in it.  Denies any use of any artificial sweeteners.  Past Medical History:  Diagnosis Date  . Asthma    childhood  . Chicken pox   . Chronic back pain    Said spontaneous thoracic and lumbar spine fractures secondary to falls.  . Colon polyps   . Coronary artery disease involving native coronary artery without angina pectoris 07/2016   In setting of inferior STEMI, cardiac cath showed occluded RCA.  Too small for PCI  . Essential hypertension   . Glaucoma   . 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  Injured her face. ->  Both aspirin and Plavix were stopped. ->  No residual findings  .  Hearing loss   . 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 (no stents)   . Hyperlipidemia with target LDL less than 70    On rosuvastatin  . Hypothyroidism (acquired)    On Synthroid  . 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 plavix so this was stopped   . Multiple fracture    x 5 vertebrae   . Urinary incontinence   . UTI (urinary tract infection)     Past Surgical History:  Procedure Laterality Date  . ABDOMINAL HYSTERECTOMY     2/2 endometriosis and DUB  . BLADDER REPAIR    . cataract surgery     b/l  . CESAREAN SECTION    . 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 Sabra Heck in [facility]  . CRANIOTOMY     SAH evaccuation  . RIGHT AND LEFT HEART CATH  07/2016   Inferior STEMI.  Culprit lesion mRPDA = was too small to address.  20% pLAD.  LCx-LPL normal.  Patent.  RCA ostial 30% RCA dominant.  Hazy plaque rupture and a small 1.5 mm PDA. --> LV Gram EF 50%, Inf HK. Increased LVEDP  . TONSILLECTOMY    . TRANSTHORACIC ECHOCARDIOGRAM  11/2015   Right is being within normal  limits.    Prior to Admission medications   Medication Sig Start Date End Date Taking? Authorizing Provider  aspirin 81 MG chewable tablet Chew by mouth daily.    [provider]  Calcium Carb-Cholecalciferol (CALCIUM 1000 + D PO) Take by mouth 2 (two) times daily.  Patient not taking: Reported on 11/06/2019    [provider]  carvedilol (COREG) 12.5 MG tablet Take 1 tablet (12.5 mg total) by mouth 2 (two) times daily with a meal. 12/31/18   Leonie Man, MD  ESTROGENS CONJ SYNTHETIC B PO Take by mouth daily.     [provider]  levothyroxine (SYNTHROID) 88 MCG tablet Take 1 tablet (88 mcg total) by mouth daily before breakfast. 03/24/19   Montes, Amy Glow, MD  losartan (COZAAR) 50 MG tablet Take 1 tablet (50 mg total) by mouth 2 (two) times daily. 12/31/18   Leonie Man, MD    LOTEMAX 0.5 % ophthalmic suspension 2 (two) times daily.  11/17/18   [provider]  Multiple Vitamins-Minerals (MULTIVITAMIN ADULTS PO) Take by mouth daily.  Patient not taking: Reported on 11/06/2019    [provider]  rosuvastatin (CRESTOR) 20 MG tablet Take 1 tablet (20 mg total) by mouth daily. 03/24/19   Montes, Amy Glow, MD  VITAMIN D PO Take 4,000 Int'l Units by mouth daily. Patient not taking: Reported on 11/06/2019    [provider]  zinc gluconate 50 MG tablet Take 50 mg by mouth daily. Patient not taking: Reported on 11/06/2019    [provider]    Family History  Problem Relation Age of Onset  . Other Mother        Old age  . Hearing loss Mother   . Cancer Father        colon died in 78  . CVA Father   . Lupus Sister   . Lung cancer Brother   . Cancer Brother        lung smoker  . Heart disease Maternal Grandmother   . Dementia Sister   . Colon cancer Brother   . Cancer Brother        colon  . Cancer Son        testicular cancer dx age 3 now 72 as of 12/2018   . Cancer Sister        skin     Social History   Tobacco Use  . Smoking status: Never Smoker  . Smokeless tobacco: Never Used  Vaping Use  . Vaping Use: Never used  Substance Use Topics  . Alcohol use: Never  . Drug use: Never    Allergies as of 12/02/2019 - Review Complete 11/10/2019  Allergen Reaction Noted  . Chocolate  01/07/2019  . Lactose intolerance (gi)  01/30/2019    Review of Systems:    All systems reviewed and negative except where noted in HPI.   Physical Exam:  There were no vitals taken for this visit. No LMP recorded. Patient has had a hysterectomy. Psych:  Alert and cooperative. Normal mood and affect. General:   Alert,  Well-developed, well-nourished, pleasant and cooperative in NAD Head:  Normocephalic and atraumatic. Eyes:  Sclera clear, no icterus.   Conjunctiva pink. Ears:  Normal auditory acuity. Lungs:   Respirations even and unlabored.  Clear throughout to auscultation.   No wheezes, crackles, or rhonchi. No acute distress. Heart:  Regular rate and rhythm; no murmurs, clicks, rubs, or gallops. Abdomen:  Normal bowel sounds.  No  bruits.  Soft, non-tender and non-distended without masses, hepatosplenomegaly or hernias noted.  No guarding or rebound tenderness.    Neurologic:  Alert and oriented x3;  grossly normal neurologically. Psych:  Alert and cooperative. Normal mood and affect.  Imaging Studies: No results found.  Assessment and Plan:   Amy Montes is a 79 y.o. y/o female has been referred for change in bowel habits with  diarrhea.  CBC and CMP in May 2021 were normal.  No recent abdominal imaging.  Ultrasound abdomen in 2020 was normal.  The fact that the abdominal pain is ongoing for over a year with no other red flag findings probably a bit reassuring.  The nature of the pain does not clearly fit anything particularly  Plan 1.  Colonoscopy to evaluate change in bowel habits 2.  Stool studies to rule out infection, will also get fecal calprotectin 3.  If above tests are inconclusive then will require CT scan of the abdomen and pelvis.  I have discussed alternative options, risks & benefits,  which include, but are not limited to, bleeding, infection, perforation,respiratory complication & drug reaction.  The patient agrees with this plan & written consent will be obtained.     Follow up in 6 weeks change in bowel habits  Dr Amy Bellows MD,MRCP(U.K)

## 2019-12-06 ENCOUNTER — Telehealth: Payer: Self-pay | Admitting: Internal Medicine

## 2019-12-06 NOTE — Telephone Encounter (Signed)
Please try to move appt up keep 02/2020 appt as well  BP was elevated at GI appt needs in person appt   Thanks [place]

## 2019-12-07 NOTE — Telephone Encounter (Signed)
Called patient, she says her BP goes up every time she sees a doctor, white coat syndrome. She had been taking her BP and it is back to her normal. She sees her Cardiologist in a few weeks. She feels fine and said she would call office if she needs to be seen.

## 2019-12-08 ENCOUNTER — Other Ambulatory Visit
Admission: RE | Admit: 2019-12-08 | Discharge: 2019-12-08 | Disposition: A | Discharge status: Home / Self Care | Payer: Medicare Other | Source: Ambulatory Visit | Attending: Gastroenterology | Admitting: Gastroenterology

## 2019-12-08 ENCOUNTER — Other Ambulatory Visit: Payer: Self-pay

## 2019-12-08 DIAGNOSIS — Z20822 Contact with and (suspected) exposure to covid-19: Secondary | ICD-10-CM | POA: Insufficient documentation

## 2019-12-08 DIAGNOSIS — Z01812 Encounter for preprocedural laboratory examination: Secondary | ICD-10-CM | POA: Diagnosis present

## 2019-12-09 ENCOUNTER — Other Ambulatory Visit: Payer: Self-pay

## 2019-12-09 LAB — SARS CORONAVIRUS 2 (TAT 6-24 HRS): SARS Coronavirus 2: NEGATIVE

## 2019-12-09 MED ORDER — CARVEDILOL 12.5 MG PO TABS
12.5000 mg | ORAL_TABLET | Freq: Two times a day (BID) | ORAL | 3 refills | Status: DC
Start: 2019-12-09 — End: 2020-07-01

## 2019-12-09 MED ORDER — LOSARTAN POTASSIUM 50 MG PO TABS
50.0000 mg | ORAL_TABLET | Freq: Two times a day (BID) | ORAL | 3 refills | Status: DC
Start: 2019-12-09 — End: 2020-07-01

## 2019-12-09 NOTE — Telephone Encounter (Signed)
Refill sent for Losartan & Carvedilol

## 2019-12-10 ENCOUNTER — Other Ambulatory Visit: Payer: Self-pay

## 2019-12-10 ENCOUNTER — Ambulatory Visit
Admission: RE | Admit: 2019-12-10 | Discharge: 2019-12-10 | Disposition: A | Discharge status: Home / Self Care | Payer: Medicare Other | Attending: Gastroenterology | Admitting: Gastroenterology

## 2019-12-10 ENCOUNTER — Ambulatory Visit: Payer: Medicare Other | Admitting: Anesthesiology

## 2019-12-10 ENCOUNTER — Encounter: Payer: Self-pay | Admitting: Gastroenterology

## 2019-12-10 ENCOUNTER — Encounter
Admission: RE | Disposition: A | Discharge status: Home / Self Care | Payer: Self-pay | Source: Home / Self Care | Attending: Gastroenterology

## 2019-12-10 DIAGNOSIS — Z79899 Other long term (current) drug therapy: Secondary | ICD-10-CM | POA: Insufficient documentation

## 2019-12-10 DIAGNOSIS — R197 Diarrhea, unspecified: Secondary | ICD-10-CM | POA: Diagnosis not present

## 2019-12-10 DIAGNOSIS — G8929 Other chronic pain: Secondary | ICD-10-CM | POA: Diagnosis not present

## 2019-12-10 DIAGNOSIS — H409 Unspecified glaucoma: Secondary | ICD-10-CM | POA: Insufficient documentation

## 2019-12-10 DIAGNOSIS — Z8043 Family history of malignant neoplasm of testis: Secondary | ICD-10-CM | POA: Insufficient documentation

## 2019-12-10 DIAGNOSIS — I10 Essential (primary) hypertension: Secondary | ICD-10-CM | POA: Diagnosis not present

## 2019-12-10 DIAGNOSIS — Z90710 Acquired absence of both cervix and uterus: Secondary | ICD-10-CM | POA: Diagnosis not present

## 2019-12-10 DIAGNOSIS — Z823 Family history of stroke: Secondary | ICD-10-CM | POA: Diagnosis not present

## 2019-12-10 DIAGNOSIS — K5730 Diverticulosis of large intestine without perforation or abscess without bleeding: Secondary | ICD-10-CM | POA: Insufficient documentation

## 2019-12-10 DIAGNOSIS — Z808 Family history of malignant neoplasm of other organs or systems: Secondary | ICD-10-CM | POA: Insufficient documentation

## 2019-12-10 DIAGNOSIS — Z820 Family history of epilepsy and other diseases of the nervous system: Secondary | ICD-10-CM | POA: Insufficient documentation

## 2019-12-10 DIAGNOSIS — I252 Old myocardial infarction: Secondary | ICD-10-CM | POA: Diagnosis not present

## 2019-12-10 DIAGNOSIS — I2510 Atherosclerotic heart disease of native coronary artery without angina pectoris: Secondary | ICD-10-CM | POA: Diagnosis not present

## 2019-12-10 DIAGNOSIS — K219 Gastro-esophageal reflux disease without esophagitis: Secondary | ICD-10-CM | POA: Insufficient documentation

## 2019-12-10 DIAGNOSIS — Z947 Corneal transplant status: Secondary | ICD-10-CM | POA: Diagnosis not present

## 2019-12-10 DIAGNOSIS — E039 Hypothyroidism, unspecified: Secondary | ICD-10-CM | POA: Insufficient documentation

## 2019-12-10 DIAGNOSIS — J45909 Unspecified asthma, uncomplicated: Secondary | ICD-10-CM | POA: Insufficient documentation

## 2019-12-10 DIAGNOSIS — Z801 Family history of malignant neoplasm of trachea, bronchus and lung: Secondary | ICD-10-CM | POA: Insufficient documentation

## 2019-12-10 DIAGNOSIS — E785 Hyperlipidemia, unspecified: Secondary | ICD-10-CM | POA: Diagnosis not present

## 2019-12-10 DIAGNOSIS — M549 Dorsalgia, unspecified: Secondary | ICD-10-CM | POA: Diagnosis not present

## 2019-12-10 DIAGNOSIS — Z800 Family history of malignant neoplasm of digestive organs: Secondary | ICD-10-CM | POA: Diagnosis not present

## 2019-12-10 DIAGNOSIS — Z91018 Allergy to other foods: Secondary | ICD-10-CM | POA: Diagnosis not present

## 2019-12-10 DIAGNOSIS — R194 Change in bowel habit: Secondary | ICD-10-CM

## 2019-12-10 DIAGNOSIS — H9190 Unspecified hearing loss, unspecified ear: Secondary | ICD-10-CM | POA: Insufficient documentation

## 2019-12-10 DIAGNOSIS — E739 Lactose intolerance, unspecified: Secondary | ICD-10-CM | POA: Diagnosis not present

## 2019-12-10 DIAGNOSIS — Z8249 Family history of ischemic heart disease and other diseases of the circulatory system: Secondary | ICD-10-CM | POA: Insufficient documentation

## 2019-12-10 HISTORY — PX: COLONOSCOPY WITH PROPOFOL: SHX5780

## 2019-12-10 LAB — GI PROFILE, STOOL, PCR

## 2019-12-10 LAB — CALPROTECTIN, FECAL: Calprotectin, Fecal: 19 ug/g (ref 0–120)

## 2019-12-10 LAB — CLOSTRIDIUM DIFFICILE BY PCR: Toxigenic C. Difficile by PCR: NEGATIVE

## 2019-12-10 SURGERY — COLONOSCOPY WITH PROPOFOL
Anesthesia: General

## 2019-12-10 MED ORDER — PROPOFOL 500 MG/50ML IV EMUL
INTRAVENOUS | Status: DC | PRN
  Administered 2019-12-10: 125 ug/kg/min via INTRAVENOUS

## 2019-12-10 MED ORDER — PROPOFOL 500 MG/50ML IV EMUL
INTRAVENOUS | Status: AC
  Filled 2019-12-10: qty 50

## 2019-12-10 MED ORDER — SODIUM CHLORIDE 0.9 % IV SOLN: INTRAVENOUS | Status: DC

## 2019-12-10 NOTE — Transfer of Care (Signed)
Immediate Anesthesia Transfer of Care Note  Patient: Amy Montes  Procedure(s) Performed: COLONOSCOPY WITH PROPOFOL (N/A )  Patient Location: PACU  Anesthesia Type:General  Level of Consciousness: awake and sedated  Airway & Oxygen Therapy: Patient Spontanous Breathing and Patient connected to nasal cannula oxygen  Post-op Assessment: Report given to RN and Post -op Vital signs reviewed and stable  Post vital signs: Reviewed and stable  Last Vitals:  Vitals Value Taken Time  BP    Temp    Pulse    Resp    SpO2      Last Pain:  Vitals:   12/10/19 0903  TempSrc: Temporal  PainSc: 0-No pain         Complications: No complications documented.

## 2019-12-10 NOTE — Anesthesia Preprocedure Evaluation (Signed)
Anesthesia Evaluation  Patient identified by MRN, date of birth, ID band Patient awake    Reviewed: Allergy & Precautions, H&P , NPO status , Patient's Chart, lab work & pertinent test results  History of Anesthesia Complications Negative for: history of anesthetic complications  Airway Mallampati: III  TM Distance: >3 FB Neck ROM: limited    Dental  (+) Chipped, Poor Dentition, Missing   Pulmonary neg shortness of breath, asthma ,    Pulmonary exam normal        Cardiovascular Exercise Tolerance: Good hypertension, (-) angina+ CAD  (-) DOE Normal cardiovascular exam     Neuro/Psych negative neurological ROS  negative psych ROS   GI/Hepatic Neg liver ROS, GERD  Medicated and Controlled,  Endo/Other  Hypothyroidism   Renal/GU negative Renal ROS  negative genitourinary   Musculoskeletal   Abdominal   Peds  Hematology negative hematology ROS (+)   Anesthesia Other Findings Past Medical History: No date: Asthma     Comment:  childhood No date: Chicken pox No date: Chronic back pain     Comment:  Said spontaneous thoracic and lumbar spine fractures               secondary to falls. No date: Colon polyps 07/2016: Coronary artery disease involving native coronary artery  without angina pectoris     Comment:  In setting of inferior STEMI, cardiac cath showed               occluded RCA.  Too small for PCI No date: Essential hypertension No date: Glaucoma 03/2016: H/O traumatic subdural hematoma     Comment:  She fell while in [place] visiting.  Injured her               face. ->  Both aspirin and Plavix were stopped. ->  No               residual findings No date: Hearing loss 07/31/2016: History of inferior STEMI     Comment:  Culprit lesion was small caliber mRPDA - toosmall for               PCI. (no stents)  No date: Hyperlipidemia with target LDL less than 70     Comment:  On rosuvastatin No date:  Hypothyroidism (acquired)     Comment:  On Synthroid No date: Intracranial hemorrhage (HCC)     Comment:  s/p fall years ago when on ? plavix so this was stopped  No date: Multiple fracture     Comment:  x 5 vertebrae  No date: Urinary incontinence No date: UTI (urinary tract infection)  Past Surgical History: No date: ABDOMINAL HYSTERECTOMY     Comment:  2/2 endometriosis and DUB No date: BLADDER REPAIR No date: cataract surgery     Comment:  b/l No date: CESAREAN SECTION No date: CORNEAL TRANSPLANT; Left     Comment:  Apparently was a failed attempt.2018 Dr. Sabra Heck in [facility] No date: CRANIOTOMY     Comment:  [place] evaccuation 07/2016: RIGHT AND LEFT HEART CATH     Comment:  Inferior STEMI.  Culprit lesion mRPDA = was too small to              address.  20% pLAD.  LCx-LPL normal.  Patent.  RCA ostial              30% RCA dominant.  Hazy plaque rupture and a small 1.5 mm  PDA. --> LV Gram EF 50%, Inf HK. Increased LVEDP No date: TONSILLECTOMY 11/2015: TRANSTHORACIC ECHOCARDIOGRAM     Comment:  Right is being within normal limits.  BMI    Body Mass Index: 28.32 kg/m      Reproductive/Obstetrics negative OB ROS                             Anesthesia Physical Anesthesia Plan  ASA: III  Anesthesia Plan: General   Post-op Pain Management:    Induction: Intravenous  PONV Risk Score and Plan: Propofol infusion and TIVA  Airway Management Planned: Natural Airway and Nasal Cannula  Additional Equipment:   Intra-op Plan:   Post-operative Plan:   Informed Consent: I have reviewed the patients History and Physical, chart, labs and discussed the procedure including the risks, benefits and alternatives for the proposed anesthesia with the patient or authorized representative who has indicated his/her understanding and acceptance.     Dental Advisory Given  Plan Discussed with: Anesthesiologist, CRNA and Surgeon  Anesthesia Plan Comments:  (Patient consented for risks of anesthesia including but not limited to:  - adverse reactions to medications - risk of intubation if required - damage to eyes, teeth, lips or other oral mucosa - nerve damage due to positioning  - sore throat or hoarseness - Damage to heart, brain, nerves, lungs, other parts of body or loss of life  Patient voiced understanding.)        Anesthesia Quick Evaluation

## 2019-12-10 NOTE — H&P (Signed)
Jonathon Bellows, MD [address], [place], [place], [place], [address] [address], [place], [place], [place], [address] Phone: [phone]  Fax: [phone]  Primary Care Physician:  McLean-Scocuzza, [name], MD   Pre-Procedure History & Physical: HPI:  Amy Montes is a 79 y.o. female is here for an colonoscopy.   Past Medical History:  Diagnosis Date  . Asthma    childhood  . Chicken pox   . Chronic back pain    Said spontaneous thoracic and lumbar spine fractures secondary to falls.  . Colon polyps   . Coronary artery disease involving native coronary artery without angina pectoris 07/2016   In setting of inferior STEMI, cardiac cath showed occluded RCA.  Too small for PCI  . Essential hypertension   . Glaucoma   . 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  Injured her face. ->  Both aspirin and Plavix were stopped. ->  No residual findings  . Hearing loss   . 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 (no stents)   . Hyperlipidemia with target LDL less than 70    On rosuvastatin  . Hypothyroidism (acquired)    On Synthroid  . 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 plavix so this was stopped   . Multiple fracture    x 5 vertebrae   . Urinary incontinence   . UTI (urinary tract infection)     Past Surgical History:  Procedure Laterality Date  . ABDOMINAL HYSTERECTOMY     2/2 endometriosis and DUB  . BLADDER REPAIR    . cataract surgery     b/l  . CESAREAN SECTION    . 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 Sabra Heck in [facility]  . CRANIOTOMY     SAH evaccuation  . RIGHT AND LEFT HEART CATH  07/2016   Inferior STEMI.  Culprit lesion mRPDA = was too small to address.  20% pLAD.  LCx-LPL normal.  Patent.  RCA ostial 30% RCA dominant.  Hazy plaque rupture and a small 1.5 mm PDA. --> LV Gram EF 50%, Inf HK. Increased  LVEDP  . TONSILLECTOMY    . TRANSTHORACIC ECHOCARDIOGRAM  11/2015   Right is being within normal limits.    Prior to Admission medications   Medication Sig Start Date End Date Taking? Authorizing Provider  carvedilol (COREG) 12.5 MG tablet Take 1 tablet (12.5 mg total) by mouth 2 (two) times daily with a meal. 12/09/19  Yes Wellington Hampshire, MD  levothyroxine (SYNTHROID) 88 MCG tablet Take 1 tablet (88 mcg total) by mouth daily before breakfast. 03/24/19  Yes McLean-Scocuzza, [name], MD  losartan (COZAAR) 50 MG tablet Take 1 tablet (50 mg total) by mouth 2 (two) times daily. 12/09/19  Yes Wellington Hampshire, MD  LOTEMAX 0.5 % ophthalmic suspension 2 (two) times daily.  11/17/18  Yes [provider]  rosuvastatin (CRESTOR) 20 MG tablet Take 1 tablet (20 mg total) by mouth daily. 03/24/19  Yes McLean-Scocuzza, [name], MD  aspirin 81 MG chewable tablet Chew by mouth daily.    [provider]  Calcium Carb-Cholecalciferol (CALCIUM 1000 + D PO) Take by mouth 2 (two) times daily.  Patient not taking: Reported on 11/06/2019    [provider]  ESTROGENS CONJ SYNTHETIC B PO Take by mouth daily.  [provider]  Multiple Vitamins-Minerals (MULTIVITAMIN ADULTS PO) Take by mouth daily.  Patient not taking: Reported on 11/06/2019    [provider]  VITAMIN D PO Take 4,000 Int'l Units by mouth daily. Patient not taking: Reported on 11/06/2019    [provider]  zinc gluconate 50 MG tablet Take 50 mg by mouth daily. Patient not taking: Reported on 11/06/2019    [provider]    Allergies as of 12/02/2019 - Review Complete 12/02/2019  Allergen Reaction Noted  . Chocolate  01/07/2019  . Lactose intolerance (gi)  01/30/2019    Family History  Problem Relation Age of Onset  . Other Mother        Old age  . Hearing loss Mother   . Cancer Father        colon died in 6  . CVA Father   . Lupus Sister   . Lung cancer Brother   .  Cancer Brother        lung smoker  . Heart disease Maternal Grandmother   . Dementia Sister   . Colon cancer Brother   . Cancer Brother        colon  . Cancer Son        testicular cancer dx age 23 now 59 as of 12/2018   . Cancer Sister        skin    Social History   Socioeconomic History  . Marital status: Single    Spouse name: Not on file  . Number of children: 2  . Years of education: Not on file  . Highest education level: High school graduate  Occupational History  . Occupation: Nanny  Tobacco Use  . Smoking status: Never Smoker  . Smokeless tobacco: Never Used  Vaping Use  . Vaping Use: Never used  Substance and Sexual Activity  . Alcohol use: Never  . Drug use: Never  . Sexual activity: Not Currently  Other Topics Concern  . Not on file  Social History Narrative   Recently moved from [place], [place].->  Now lives in [place], [place]   Lives alone, but very close to her son Fara Olden and his wife.   Her daughter lives in [place], [place]   She has 3 grandchildren.   widow      She drinks coffee.     She does water aerobics/pool exercises.      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  Prior to moving to [place], she was the primary caregiver for her brother who passed away in 07-02-2017.  Once he died, her children convinced her to move up to [place] to be near them.      6 other siblings 2 1/2 siblings but those 1/2 siblings died in young age   Social Determinants of Health   Financial Resource Strain: Low Risk   . Difficulty of Paying Living Expenses: Not hard at all  Food Insecurity: No Food Insecurity  . Worried About Charity fundraiser in the Last Year: Never true  . Ran Out of Food in the Last Year: Never true  Transportation Needs: No Transportation Needs  . Lack of Transportation (Medical): No  . Lack of Transportation (Non-Medical): No  Physical Activity: Insufficiently Active  . Days of Exercise per Week: 4 days  . Minutes of Exercise per Session: 20 min  Stress:  No Stress Concern Present  . Feeling of Stress : Not at all  Social Connections: Unknown  .  Frequency of Communication with Friends and Family: More than three times a week  . Frequency of Social Gatherings with Friends and Family: More than three times a week  . Attends Religious Services: Not on file  . Active Member of Clubs or Organizations: Not on file  . Attends Archivist Meetings: Not on file  . Marital Status: Widowed  Intimate Partner Violence: Not At Risk  . Fear of Current or Ex-Partner: No  . Emotionally Abused: No  . Physically Abused: No  . Sexually Abused: No    Review of Systems: See HPI, otherwise negative ROS  Physical Exam: BP 128/68   Pulse 76   Temp 98 F (36.7 C) (Temporal)   Resp 16   Ht 5' (1.524 m)   Wt 65.8 kg   SpO2 95%   BMI 28.32 kg/m  General:   Alert,  pleasant and cooperative in NAD Head:  Normocephalic and atraumatic. Neck:  Supple; no masses or thyromegaly. Lungs:  Clear throughout to auscultation, normal respiratory effort.    Heart:  +S1, +S2, Regular rate and rhythm, No edema. Abdomen:  Soft, nontender and nondistended. Normal bowel sounds, without guarding, and without rebound.   Neurologic:  Alert and  oriented x4;  grossly normal neurologically.  Impression/Plan: LILLER YOHN is here for an colonoscopy to be performed for change in bowel habits Risks, benefits, limitations, and alternatives regarding  colonoscopy have been reviewed with the patient.  Questions have been answered.  All parties agreeable.   Jonathon Bellows, MD  12/10/2019, 9:37 AM

## 2019-12-10 NOTE — Anesthesia Procedure Notes (Signed)
Performed by: Cook-Martin, Marthann Abshier Pre-anesthesia Checklist: Patient identified, Emergency Drugs available, Suction available, Patient being monitored and Timeout performed Patient Re-evaluated:Patient Re-evaluated prior to induction Oxygen Delivery Method: Nasal cannula Preoxygenation: Pre-oxygenation with 100% oxygen Induction Type: IV induction Placement Confirmation: positive ETCO2 and CO2 detector       

## 2019-12-10 NOTE — Op Note (Signed)
[name] Gastroenterology Patient Name: Amy Montes Procedure Date: 12/10/2019 9:37 AM MRN: [id number] Account #: [id number] Date of Birth: October 16, 1940 Admit Type: Outpatient Age: 79 Room: [facility] ENDO ROOM 1 Gender: Female Note Status: Finalized Procedure:             Colonoscopy Indications:           Change in bowel habits Providers:             Jonathon Bellows MD, MD Referring MD:          [name] Mclean-Scocuzza MD, MD (Referring MD) Medicines:             Monitored Anesthesia Care Complications:         No immediate complications. Procedure:             Pre-Anesthesia Assessment:                        - Prior to the procedure, a History and Physical was                         performed, and patient medications, allergies and                         sensitivities were reviewed. The patient's tolerance                         of previous anesthesia was reviewed.                        - The risks and benefits of the procedure and the                         sedation options and risks were discussed with the                         patient. All questions were answered and informed                         consent was obtained.                        - ASA Grade Assessment: III - A patient with severe                         systemic disease.                        After obtaining informed consent, the colonoscope was                         passed under direct vision. Throughout the procedure,                         the patient's blood pressure, pulse, and oxygen                         saturations were monitored continuously. The                         Colonoscope was introduced through the anus  and                         advanced to the the cecum, identified by the                         appendiceal orifice. The colonoscopy was performed                         with ease. The patient tolerated the procedure well.                         The quality of the  bowel preparation was excellent. Findings:      The perianal and digital rectal examinations were normal.      The colon (entire examined portion) appeared normal. Biopsies for       histology were taken with a cold forceps from the entire colon for       evaluation of microscopic colitis.      Multiple small-mouthed diverticula were found in the sigmoid colon.      The exam was otherwise without abnormality on direct and retroflexion       views. Impression:            - The entire examined colon is normal. Biopsied.                        - Diverticulosis in the sigmoid colon.                        - The examination was otherwise normal on direct and                         retroflexion views. Recommendation:        - Discharge patient to home (with escort).                        - Resume previous diet.                        - Continue present medications.                        - Await pathology results.                        - Return to my office in 6 weeks. Procedure Code(s):     --- Professional ---                        [phone], Colonoscopy, flexible; with biopsy, single or                         multiple Diagnosis Code(s):     --- Professional ---                        R19.4, Change in bowel habit                        K57.30, Diverticulosis of large intestine without  perforation or abscess without bleeding CPT copyright 2019 American Medical Association. All rights reserved. The codes documented in this report are preliminary and upon coder review may  be revised to meet current compliance requirements. Jonathon Bellows, MD Jonathon Bellows MD, MD 12/10/2019 9:57:10 AM This report has been signed electronically. Number of Addenda: 0 Note Initiated On: 12/10/2019 9:37 AM Scope Withdrawal Time: 0 hours 8 minutes 14 seconds  Total Procedure Duration: 0 hours 12 minutes 59 seconds  Estimated Blood Loss:  Estimated blood loss: none.      [facility]

## 2019-12-11 NOTE — Anesthesia Postprocedure Evaluation (Signed)
Anesthesia Post Note  Patient: Amy Montes  Procedure(s) Performed: COLONOSCOPY WITH PROPOFOL (N/A )  Patient location during evaluation: Endoscopy Anesthesia Type: General Level of consciousness: awake and alert Pain management: pain level controlled Vital Signs Assessment: post-procedure vital signs reviewed and stable Respiratory status: spontaneous breathing, nonlabored ventilation, respiratory function stable and patient connected to nasal cannula oxygen Cardiovascular status: blood pressure returned to baseline and stable Postop Assessment: no apparent nausea or vomiting Anesthetic complications: no   No complications documented.   Last Vitals:  Vitals:   12/10/19 1017 12/10/19 1027  BP:  (!) 129/94  Pulse:    Resp: 18 (!) 21  Temp:    SpO2:      Last Pain:  Vitals:   12/10/19 1027  TempSrc:   PainSc: 0-No pain                 Precious Haws Lynton Crescenzo

## 2019-12-14 ENCOUNTER — Encounter: Payer: Self-pay | Admitting: Gastroenterology

## 2019-12-14 LAB — SURGICAL PATHOLOGY

## 2019-12-16 ENCOUNTER — Telehealth: Payer: Self-pay | Admitting: Internal Medicine

## 2019-12-16 NOTE — Telephone Encounter (Signed)
Patient approved for Prolia and patient scheduled for 12/17/19

## 2019-12-17 ENCOUNTER — Other Ambulatory Visit: Payer: Self-pay

## 2019-12-17 ENCOUNTER — Ambulatory Visit (INDEPENDENT_AMBULATORY_CARE_PROVIDER_SITE_OTHER): Payer: Medicare Other

## 2019-12-17 DIAGNOSIS — M818 Other osteoporosis without current pathological fracture: Secondary | ICD-10-CM

## 2019-12-17 DIAGNOSIS — M810 Age-related osteoporosis without current pathological fracture: Secondary | ICD-10-CM | POA: Diagnosis not present

## 2019-12-17 DIAGNOSIS — M8000XD Age-related osteoporosis with current pathological fracture, unspecified site, subsequent encounter for fracture with routine healing: Secondary | ICD-10-CM

## 2019-12-17 MED ORDER — DENOSUMAB 60 MG/ML ~~LOC~~ SOSY
60.0000 mg | PREFILLED_SYRINGE | Freq: Once | SUBCUTANEOUS | Status: AC
  Administered 2019-12-17: 60 mg via SUBCUTANEOUS

## 2019-12-17 NOTE — Progress Notes (Signed)
Patient presented for 6-month Prolia injection SQ to left arm. Patient tolerated well. 

## 2019-12-31 ENCOUNTER — Other Ambulatory Visit: Payer: Self-pay

## 2019-12-31 ENCOUNTER — Encounter: Payer: Self-pay | Admitting: Cardiovascular Disease

## 2019-12-31 ENCOUNTER — Ambulatory Visit (INDEPENDENT_AMBULATORY_CARE_PROVIDER_SITE_OTHER): Payer: Medicare Other | Admitting: Cardiovascular Disease

## 2019-12-31 VITALS — BP 138/80 | HR 62 | Ht 59.0 in | Wt 146.5 lb

## 2019-12-31 DIAGNOSIS — E785 Hyperlipidemia, unspecified: Secondary | ICD-10-CM | POA: Diagnosis not present

## 2019-12-31 DIAGNOSIS — I10 Essential (primary) hypertension: Secondary | ICD-10-CM

## 2019-12-31 DIAGNOSIS — I2510 Atherosclerotic heart disease of native coronary artery without angina pectoris: Secondary | ICD-10-CM

## 2019-12-31 NOTE — Progress Notes (Signed)
Cardiology Office Note   Date:  12/31/2019   ID:  Amy Montes, Amy Montes Mar 19, 1941, MRN [id number]  PCP:  McLean-Scocuzza, [name], MD  Cardiologist:   Kathlyn Sacramento, MD   Chief Complaint  Patient presents with   office visit    6 month F/U-Discuss echo results; Meds verbally reviewed with patient.      History of Present Illness: Amy Montes is a 79 y.o. female who presents for a follow-up visit regarding coronary artery disease.  Her initial care was in [place].  She lives in [place]. She was hospitalized in May 2018 with inferior ST elevation myocardial infarction.  Cardiac catheterization showed plaque rupture in the right PDA which was felt to be too small for angioplasty.  EF was 50% with inferior wall hypokinesis.  LVEDP was mildly elevated at 26 mmHg.  She was treated medically. Cath report at that time showed 20% proximal LAD stenosis, 30% ostial RCA stenosis and ruptured plaque in a small right PDA.  Echo in 2017 showed normal LV systolic function. 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  She is still able to drive.  Due to worsening exertional dyspnea, an echocardiogram was done in June which showed an EF of 60 to 36%, normal diastolic function, normal pulmonary pressure and no significant valvular abnormalities. She is suspected of whitecoat syndrome given discrepancy between office and home readings. She has been doing well with no recent chest pain, shortness of breath or palpitations.  Past Medical History:  Diagnosis Date   Asthma    childhood   Chicken pox    Chronic back pain    Said spontaneous thoracic and lumbar spine fractures secondary to falls.   Colon polyps    Coronary artery disease involving native coronary artery without angina pectoris 07/2016   In setting of inferior STEMI, cardiac cath showed occluded RCA.  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  Injured her face. ->  Both aspirin and Plavix were stopped. 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 (no stents)    Hyperlipidemia with target LDL less than 70    On rosuvastatin   Hypothyroidism (acquired)    On Synthroid   Intracranial hemorrhage (HCC)    s/p fall years ago when on ? plavix so this was stopped    Multiple fracture    x 5 vertebrae    Urinary incontinence    UTI (urinary tract infection)     Past Surgical History:  Procedure Laterality Date   ABDOMINAL HYSTERECTOMY     2/2 endometriosis and DUB   BLADDER REPAIR     cataract surgery     b/l   CESAREAN SECTION     COLONOSCOPY WITH PROPOFOL N/A 12/10/2019   Procedure: COLONOSCOPY WITH PROPOFOL;  Surgeon: Jonathon Bellows, MD;  Location[facility] ENDOSCOPY;  Service: Gastroenterology;  Laterality: N/A;   CORNEAL TRANSPLANT Left    Apparently was a failed attempt.2018 Dr. Sabra Heck in [place]     [facility] evaccuation   RIGHT AND LEFT HEART CATH  07/2016   Inferior STEMI.  Culprit lesion mRPDA = was too small to address.  20% pLAD.  LCx-LPL normal.  Patent.  RCA ostial  30% RCA dominant.  Hazy plaque rupture and a small 1.5 mm PDA. --> LV Gram EF 50%, Inf HK. Increased LVEDP   TONSILLECTOMY     TRANSTHORACIC ECHOCARDIOGRAM  11/2015   Right is being within normal limits.     Current Outpatient Medications  Medication Sig Dispense Refill   aspirin 81 MG chewable tablet Chew by mouth daily.     carvedilol (COREG) 12.5 MG tablet Take 1 tablet (12.5 mg total) by mouth 2 (two) times daily with a meal. 180 tablet 3   levothyroxine (SYNTHROID) 88 MCG tablet Take 1 tablet (88 mcg total) by mouth daily before breakfast. 90 tablet 3   losartan (COZAAR) 50 MG tablet Take 1 tablet (50 mg total) by mouth 2 (two) times daily. 180 tablet 3   LOTEMAX 0.5 %  ophthalmic suspension 2 (two) times daily.      rosuvastatin (CRESTOR) 20 MG tablet Take 1 tablet (20 mg total) by mouth daily. 90 tablet 3   Calcium Carb-Cholecalciferol (CALCIUM 1000 + D PO) Take by mouth 2 (two) times daily.  (Patient not taking: Reported on 11/06/2019)     ESTROGENS CONJ SYNTHETIC B PO Take by mouth daily.      Multiple Vitamins-Minerals (MULTIVITAMIN ADULTS PO) Take by mouth daily.  (Patient not taking: Reported on 11/06/2019)     VITAMIN D PO Take 4,000 Int'l Units by mouth daily. (Patient not taking: Reported on 11/06/2019)     zinc gluconate 50 MG tablet Take 50 mg by mouth daily. (Patient not taking: Reported on 11/06/2019)     No current facility-administered medications for this visit.    Allergies:   Chocolate and Lactose intolerance (gi)    Social History:  The patient  reports that she has never smoked. She has never used smokeless tobacco. She reports that she does not drink alcohol and does not use drugs.   Family History:  The patient's family history includes CVA in her father; Cancer in her brother, brother, father, sister, and son; Colon cancer in her brother; Dementia in her sister; Hearing loss in her mother; Heart disease in her maternal grandmother; Lung cancer in her brother; Lupus in her sister; Other in her mother.    ROS:  Please see the history of present illness.   Otherwise, review of systems are positive for none.   All other systems are reviewed and negative.    PHYSICAL EXAM: VS:  BP 138/80 (BP Location: Left Arm, Patient Position: Sitting, Cuff Size: Normal)    Pulse 62    Ht 4\' 11"  (1.499 m)    Wt 146 lb 8 oz (66.5 kg)    SpO2 95%    BMI 29.59 kg/m  , BMI Body mass index is 29.59 kg/m. GEN: Well nourished, well developed, in no acute distress  HEENT: normal  Neck: no JVD, carotid bruits, or masses Cardiac: RRR; no murmurs, rubs, or gallops,no edema  Respiratory:  clear to auscultation bilaterally, normal work of breathing GI: soft,  nontender, nondistended, + BS MS: no deformity or atrophy  Skin: warm and dry, no rash Neuro:  Strength and sensation are intact Psych: euthymic mood, full affect   EKG:  EKG is ordered today. The ekg ordered today demonstrates normal sinus rhythm with no significant ST or T wave changes.   Recent Labs: 08/03/2019: ALT 14; BUN 23; Creatinine, Ser 0.94; Hemoglobin 13.3; Platelets 185.0; Potassium 4.2; Sodium 139; TSH 2.13    Lipid Panel    Component Value Date/Time  CHOL 124 08/03/2019 0801   TRIG 76.0 08/03/2019 0801   HDL 49.10 08/03/2019 0801   CHOLHDL 3 08/03/2019 0801   VLDL 15.2 08/03/2019 0801   LDLCALC 60 08/03/2019 0801      Wt Readings from Last 3 Encounters:  12/31/19 146 lb 8 oz (66.5 kg)  12/10/19 145 lb (65.8 kg)  12/02/19 146 lb 6 oz (66.4 kg)       No flowsheet data found.    ASSESSMENT AND PLAN:  1.  Coronary artery disease involving native coronary arteries without angina: She is doing very well with no anginal symptoms.  Continue medical therapy.    2.  Hyperlipidemia: Continue treatment with rosuvastatin.  Most recent lipid profile showed an LDL of 60 and triglyceride of 76.   3.  Essential hypertension: Blood pressure is controlled today.  I also reviewed her home blood pressure readings and her blood pressure tends to be on the low side.   Disposition:   FU with me in 6 months  Signed,  Kathlyn Sacramento, MD  12/31/2019 11:19 AM    [place]

## 2019-12-31 NOTE — Patient Instructions (Signed)
Medication Instructions:  Your physician recommends that you continue on your current medications as directed. Please refer to the Current Medication list given to you today.  *If you need a refill on your cardiac medications before your next appointment, please call your pharmacy*   Lab Work: None ordered If you have labs (blood work) drawn today and your tests are completely normal, you will receive your results only by: Marland Kitchen MyChart Message (if you have MyChart) OR . A paper copy in the mail If you have any lab test that is abnormal or we need to change your treatment, we will call you to review the results.   Testing/Procedures: None ordered   Follow-Up: At [facility], you and your health needs are our priority.  As part of our continuing mission to provide you with exceptional heart care, we have created designated Provider Care Teams.  These Care Teams include your primary Cardiologist (physician) and Advanced Practice Providers (APPs -  Physician Assistants and Nurse Practitioners) who all work together to provide you with the care you need, when you need it.  We recommend signing up for the patient portal called "MyChart".  Sign up information is provided on this After Visit Summary.  MyChart is used to connect with patients for Virtual Visits (Telemedicine).  Patients are able to view lab/test results, encounter notes, upcoming appointments, etc.  Non-urgent messages can be sent to your provider as well.   To learn more about what you can do with MyChart, go to [email or web address].    Your next appointment:   6 month(s)  The format for your next appointment:   In Person  Provider:   You may see Dr. Fletcher Anon or one of the following Advanced Practice Providers on your designated Care Team:    Murray Hodgkins, NP  Christell Faith, PA-C  Marrianne Mood, PA-C  Cadence Kathlen Mody, [place]    Other Instructions N/A

## 2020-01-27 ENCOUNTER — Ambulatory Visit: Payer: Medicare Other | Admitting: Gastroenterology

## 2020-01-28 ENCOUNTER — Ambulatory Visit: Payer: Medicare Other | Admitting: Gastroenterology

## 2020-02-05 ENCOUNTER — Other Ambulatory Visit: Payer: Self-pay | Admitting: Internal Medicine

## 2020-02-05 DIAGNOSIS — Z1231 Encounter for screening mammogram for malignant neoplasm of breast: Secondary | ICD-10-CM

## 2020-03-04 ENCOUNTER — Telehealth: Payer: Self-pay | Admitting: *Deleted

## 2020-03-04 NOTE — Telephone Encounter (Signed)
Please place future orders for lab appt.  

## 2020-03-07 ENCOUNTER — Other Ambulatory Visit: Payer: Medicare Other

## 2020-03-07 ENCOUNTER — Other Ambulatory Visit: Payer: Self-pay | Admitting: Internal Medicine

## 2020-03-07 DIAGNOSIS — I10 Essential (primary) hypertension: Secondary | ICD-10-CM

## 2020-03-07 DIAGNOSIS — Z13818 Encounter for screening for other digestive system disorders: Secondary | ICD-10-CM

## 2020-03-07 DIAGNOSIS — E039 Hypothyroidism, unspecified: Secondary | ICD-10-CM

## 2020-03-07 DIAGNOSIS — R7303 Prediabetes: Secondary | ICD-10-CM

## 2020-03-07 DIAGNOSIS — E785 Hyperlipidemia, unspecified: Secondary | ICD-10-CM

## 2020-03-07 DIAGNOSIS — Z1389 Encounter for screening for other disorder: Secondary | ICD-10-CM

## 2020-03-09 ENCOUNTER — Other Ambulatory Visit: Payer: Medicare Other

## 2020-03-10 ENCOUNTER — Ambulatory Visit: Payer: Medicare Other | Admitting: Internal Medicine

## 2020-04-08 ENCOUNTER — Ambulatory Visit: Payer: Medicare Other

## 2020-04-13 ENCOUNTER — Ambulatory Visit (INDEPENDENT_AMBULATORY_CARE_PROVIDER_SITE_OTHER): Payer: Medicare Other

## 2020-04-13 ENCOUNTER — Encounter (INDEPENDENT_AMBULATORY_CARE_PROVIDER_SITE_OTHER): Payer: Self-pay

## 2020-04-13 VITALS — Ht 59.0 in | Wt 146.0 lb

## 2020-04-13 DIAGNOSIS — Z0000 Encounter for general adult medical examination without abnormal findings: Secondary | ICD-10-CM | POA: Diagnosis not present

## 2020-04-13 NOTE — Patient Instructions (Addendum)
Amy Montes , Thank you for taking time to come for your Medicare Wellness Visit. I appreciate your ongoing commitment to your health goals. Please review the following plan we discussed and let me know if I can assist you in the future.   These are the goals we discussed: Goals    . Follow up with Primary Care Provider     As needed       This is a list of the screening recommended for you and due dates:  Health Maintenance  Topic Date Due  . Flu Shot  06/23/2020*  . Tetanus Vaccine  04/13/2021*  .  Hepatitis C: One time screening is recommended by Center for Disease Control  (CDC) for  adults born from [birth year] through 1963.   04/13/2021*  . Pneumonia vaccines (1 of 2 - PCV13) 04/13/2021*  . DEXA scan (bone density measurement)  Completed  . COVID-19 Vaccine  Completed  *Topic was postponed. The date shown is not the original due date.    Immunizations Immunization History  Administered Date(s) Administered  . Janssen (J&J) SARS-COV-2 Vaccination 08/21/2019, 03/01/2020   Advanced directives: End of life planning; Advance aging; Advanced directives discussed.  Copy of current HCPOA/Living Will requested.    Conditions/risks identified: none new  Follow up in one year for your annual wellness visit    Preventive Care 65 Years and Older, Female Preventive care refers to lifestyle choices and visits with your health care provider that can promote health and wellness. What does preventive care include?  A yearly physical exam. This is also called an annual well check.  Dental exams once or twice a year.  Routine eye exams. Ask your health care provider how often you should have your eyes checked.  Personal lifestyle choices, including:  Daily care of your teeth and gums.  Regular physical activity.  Eating a healthy diet.  Avoiding tobacco and drug use.  Limiting alcohol use.  Practicing safe sex.  Taking low-dose aspirin every day.  Taking vitamin and mineral  supplements as recommended by your health care provider. What happens during an annual well check? The services and screenings done by your health care provider during your annual well check will depend on your age, overall health, lifestyle risk factors, and family history of disease. Counseling  Your health care provider may ask you questions about your:  Alcohol use.  Tobacco use.  Drug use.  Emotional well-being.  Home and relationship well-being.  Sexual activity.  Eating habits.  History of falls.  Memory and ability to understand (cognition).  Work and work Statistician.  Reproductive health. Screening  You may have the following tests or measurements:  Height, weight, and BMI.  Blood pressure.  Lipid and cholesterol levels. These may be checked every 5 years, or more frequently if you are over 14 years old.  Skin check.  Lung cancer screening. You may have this screening every year starting at age 77 if you have a 30-pack-year history of smoking and currently smoke or have quit within the past 15 years.  Fecal occult blood test (FOBT) of the stool. You may have this test every year starting at age 14.  Flexible sigmoidoscopy or colonoscopy. You may have a sigmoidoscopy every 5 years or a colonoscopy every 10 years starting at age 73.  Hepatitis C blood test.  Hepatitis B blood test.  Sexually transmitted disease (STD) testing.  Diabetes screening. This is done by checking your blood sugar (glucose) after you have not eaten  for a while (fasting). You may have this done every 1-3 years.  Bone density scan. This is done to screen for osteoporosis. You may have this done starting at age 35.  Mammogram. This may be done every 1-2 years. Talk to your health care provider about how often you should have regular mammograms. Talk with your health care provider about your test results, treatment options, and if necessary, the need for more tests. Vaccines  Your  health care provider may recommend certain vaccines, such as:  Influenza vaccine. This is recommended every year.  Tetanus, diphtheria, and acellular pertussis (Tdap, Td) vaccine. You may need a Td booster every 10 years.  Zoster vaccine. You may need this after age 73.  Pneumococcal 13-valent conjugate (PCV13) vaccine. One dose is recommended after age 43.  Pneumococcal polysaccharide (PPSV23) vaccine. One dose is recommended after age 82. Talk to your health care provider about which screenings and vaccines you need and how often you need them. This information is not intended to replace advice given to you by your health care provider. Make sure you discuss any questions you have with your health care provider. Document Released: 04/08/2015 Document Revised: 11/30/2015 Document Reviewed: 01/11/2015 Elsevier Interactive Patient Education  2017 [employer] Prevention in the Home Falls can cause injuries. They can happen to people of all ages. There are many things you can do to make your home safe and to help prevent falls. What can I do on the outside of my home?  Regularly fix the edges of walkways and driveways and fix any cracks.  Remove anything that might make you trip as you walk through a door, such as a raised step or threshold.  Trim any bushes or trees on the path to your home.  Use bright outdoor lighting.  Clear any walking paths of anything that might make someone trip, such as rocks or tools.  Regularly check to see if handrails are loose or broken. Make sure that both sides of any steps have handrails.  Any raised decks and porches should have guardrails on the edges.  Have any leaves, snow, or ice cleared regularly.  Use sand or salt on walking paths during winter.  Clean up any spills in your garage right away. This includes oil or grease spills. What can I do in the bathroom?  Use night lights.  Install grab bars by the toilet and in the tub and  shower. Do not use towel bars as grab bars.  Use non-skid mats or decals in the tub or shower.  If you need to sit down in the shower, use a plastic, non-slip stool.  Keep the floor dry. Clean up any water that spills on the floor as soon as it happens.  Remove soap buildup in the tub or shower regularly.  Attach bath mats securely with double-sided non-slip rug tape.  Do not have throw rugs and other things on the floor that can make you trip. What can I do in the bedroom?  Use night lights.  Make sure that you have a light by your bed that is easy to reach.  Do not use any sheets or blankets that are too big for your bed. They should not hang down onto the floor.  Have a firm chair that has side arms. You can use this for support while you get dressed.  Do not have throw rugs and other things on the floor that can make you trip. What can I do  in the kitchen?  Clean up any spills right away.  Avoid walking on wet floors.  Keep items that you use a lot in easy-to-reach places.  If you need to reach something above you, use a strong step stool that has a grab bar.  Keep electrical cords out of the way.  Do not use floor polish or wax that makes floors slippery. If you must use wax, use non-skid floor wax.  Do not have throw rugs and other things on the floor that can make you trip. What can I do with my stairs?  Do not leave any items on the stairs.  Make sure that there are handrails on both sides of the stairs and use them. Fix handrails that are broken or loose. Make sure that handrails are as long as the stairways.  Check any carpeting to make sure that it is firmly attached to the stairs. Fix any carpet that is loose or worn.  Avoid having throw rugs at the top or bottom of the stairs. If you do have throw rugs, attach them to the floor with carpet tape.  Make sure that you have a light switch at the top of the stairs and the bottom of the stairs. If you do not  have them, ask someone to add them for you. What else can I do to help prevent falls?  Wear shoes that:  Do not have high heels.  Have rubber bottoms.  Are comfortable and fit you well.  Are closed at the toe. Do not wear sandals.  If you use a stepladder:  Make sure that it is fully opened. Do not climb a closed stepladder.  Make sure that both sides of the stepladder are locked into place.  Ask someone to hold it for you, if possible.  Clearly mark and make sure that you can see:  Any grab bars or handrails.  First and last steps.  Where the edge of each step is.  Use tools that help you move around (mobility aids) if they are needed. These include:  Canes.  Walkers.  Scooters.  Crutches.  Turn on the lights when you go into a dark area. Replace any light bulbs as soon as they burn out.  Set up your furniture so you have a clear path. Avoid moving your furniture around.  If any of your floors are uneven, fix them.  If there are any pets around you, be aware of where they are.  Review your medicines with your doctor. Some medicines can make you feel dizzy. This can increase your chance of falling. Ask your doctor what other things that you can do to help prevent falls. This information is not intended to replace advice given to you by your health care provider. Make sure you discuss any questions you have with your health care provider. Document Released: 01/06/2009 Document Revised: 08/18/2015 Document Reviewed: 04/16/2014 Elsevier Interactive Patient Education  2017 [employer].

## 2020-04-13 NOTE — Progress Notes (Signed)
Subjective:   Amy Montes is a 80 y.o. female who presents for Medicare Annual (Subsequent) preventive examination.  Review of Systems    No ROS.  Medicare Wellness Virtual Visit.   Cardiac Risk Factors include: advanced age (>56men, >40 women);hypertension     Objective:    Today's Vitals   04/13/20 1306  Weight: 146 lb (66.2 kg)  Height: 4\' 11"  (1.499 m)   Body mass index is 29.49 kg/m.  Advanced Directives 04/13/2020 12/10/2019 04/08/2019 09/19/2017  Does Patient Have a Medical Advance Directive? Yes Yes Yes Yes  Type of Paramedic of [place];Living will [place];Living will [place];Living will Living will  Does patient want to make changes to medical advance directive? No - Patient declined - - -  Copy of [place] in Chart? No - copy requested No - copy requested No - copy requested -    Current Medications (verified) Outpatient Encounter Medications as of 04/13/2020  Medication Sig  . aspirin 81 MG chewable tablet Chew by mouth daily.  . Calcium Carb-Cholecalciferol (CALCIUM 1000 + D PO) Take by mouth 2 (two) times daily.  (Patient not taking: Reported on 11/06/2019)  . carvedilol (COREG) 12.5 MG tablet Take 1 tablet (12.5 mg total) by mouth 2 (two) times daily with a meal.  . ESTROGENS CONJ SYNTHETIC B PO Take by mouth daily.   Marland Kitchen levothyroxine (SYNTHROID) 88 MCG tablet Take 1 tablet (88 mcg total) by mouth daily before breakfast.  . losartan (COZAAR) 50 MG tablet Take 1 tablet (50 mg total) by mouth 2 (two) times daily.  Marland Kitchen LOTEMAX 0.5 % ophthalmic suspension 2 (two) times daily.   . Multiple Vitamins-Minerals (MULTIVITAMIN ADULTS PO) Take by mouth daily.  (Patient not taking: Reported on 11/06/2019)  . rosuvastatin (CRESTOR) 20 MG tablet Take 1 tablet (20 mg total) by mouth daily.  Marland Kitchen VITAMIN D PO Take 4,000 Int'l Units by mouth daily. (Patient not taking: Reported on 11/06/2019)  . zinc  gluconate 50 MG tablet Take 50 mg by mouth daily. (Patient not taking: Reported on 11/06/2019)   No facility-administered encounter medications on file as of 04/13/2020.    Allergies (verified) Chocolate and Lactose intolerance (gi)   History: Past Medical History:  Diagnosis Date  . Asthma    childhood  . Chicken pox   . Chronic back pain    Said spontaneous thoracic and lumbar spine fractures secondary to falls.  . Colon polyps   . Coronary artery disease involving native coronary artery without angina pectoris 07/2016   In setting of inferior STEMI, cardiac cath showed occluded RCA.  Too small for PCI  . Essential hypertension   . Glaucoma   . 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  Injured her face. ->  Both aspirin and Plavix were stopped. ->  No residual findings  . Hearing loss   . 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 (no stents)   . Hyperlipidemia with target LDL less than 70    On rosuvastatin  . Hypothyroidism (acquired)    On Synthroid  . Intracranial hemorrhage ([place])    s/p fall years ago when on ? plavix so this was stopped   . Multiple fracture    x 5 vertebrae   . Urinary incontinence   . UTI (urinary tract infection)    Past Surgical History:  Procedure Laterality Date  . ABDOMINAL HYSTERECTOMY     2/2 endometriosis and DUB  . BLADDER REPAIR    . cataract surgery     b/l  . CESAREAN SECTION    . COLONOSCOPY WITH PROPOFOL N/A 12/10/2019   Procedure: COLONOSCOPY WITH PROPOFOL;  Surgeon: Jonathon Bellows, MD;  Location: [facility] ENDOSCOPY;  Service: Gastroenterology;  Laterality: N/A;  . 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 Sabra Heck in [facility]  . CRANIOTOMY     SAH evaccuation  . RIGHT AND LEFT HEART CATH  07/2016   Inferior STEMI.  Culprit lesion mRPDA = was too small to address.  20% pLAD.  LCx-LPL normal.  Patent.  RCA ostial 30% RCA  dominant.  Hazy plaque rupture and a small 1.5 mm PDA. --> LV Gram EF 50%, Inf HK. Increased LVEDP  . TONSILLECTOMY    . TRANSTHORACIC ECHOCARDIOGRAM  11/2015   Right is being within normal limits.   Family History  Problem Relation Age of Onset  . Other Mother        Old age  . Hearing loss Mother   . Cancer Father        colon died in 42  . CVA Father   . Lupus Sister   . Lung cancer Brother   . Cancer Brother        lung smoker  . Heart disease Maternal Grandmother   . Dementia Sister   . Colon cancer Brother   . Cancer Brother        colon  . Cancer Son        testicular cancer dx age 94 now 48 as of 12/2018   . Cancer Sister        skin   Social History   Socioeconomic History  . Marital status: Single    Spouse name: Not on file  . Number of children: 2  . Years of education: Not on file  . Highest education level: High school graduate  Occupational History  . Occupation: Nanny  Tobacco Use  . Smoking status: Never Smoker  . Smokeless tobacco: Never Used  Vaping Use  . Vaping Use: Never used  Substance and Sexual Activity  . Alcohol use: Never  . Drug use: Never  . Sexual activity: Not Currently  Other Topics Concern  . Not on file  Social History Narrative   Recently moved from [place], [place].->  Now lives in [place], [place]   Lives alone, but very close to her son Fara Olden and his wife.   Her daughter lives in [place], [place]   She has 3 grandchildren.   widow      She drinks coffee.     She does water aerobics/pool exercises.      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  Prior to moving to [place], she was the primary caregiver for her brother who passed away in 2017-07-11.  Once he died, her children convinced her to move up to [place] to be near them.      6 other siblings 2 1/2 siblings but those 1/2 siblings died in young age   Social Determinants of Health   Financial Resource Strain: Low Risk   . Difficulty of Paying Living Expenses: Not hard at all  Food  Insecurity: No Food Insecurity  . Worried About Charity fundraiser in the Last Year: Never true  . Ran Out of Food in the Last Year: Never true  Transportation Needs: No Transportation Needs  . Lack of Transportation (Medical): No  . Lack of Transportation (Non-Medical): No  Physical Activity: Not on file  Stress: No Stress Concern Present  . Feeling of Stress : Not at all  Social Connections: Unknown  . Frequency of Communication with Friends and Family: More than three times a week  . Frequency of Social Gatherings with Friends and Family: More than three times a week  . Attends Religious Services: Not on file  . Active Member of Clubs or Organizations: Not on file  . Attends Archivist Meetings: Not on file  . Marital Status: Widowed    Tobacco Counseling Counseling given: Not Answered   Clinical Intake:  Pre-visit preparation completed: Yes        Diabetes: No  How often do you need to have someone help you when you read instructions, pamphlets, or other written materials from your doctor or pharmacy?: 1 - Never   Interpreter Needed?: No      Activities of Daily Living In your present state of health, do you have any difficulty performing the following activities: 04/13/2020  Hearing? N  Vision? N  Walking or climbing stairs? N  Dressing or bathing? N  Doing errands, shopping? Y  Preparing Food and eating ? N  Using the Toilet? N  In the past six months, have you accidently leaked urine? Y  Comment Managed with daily pad  Do you have problems with loss of bowel control? N  Managing your Medications? N  Managing your Finances? N  Housekeeping or managing your Housekeeping? N  Some recent data might be hidden    Patient Care Team: McLean-Scocuzza, [name], MD as PCP - General (Internal Medicine) Leonie Man, MD as PCP - Cardiology (Cardiology)  Indicate any recent Medical Services you may have received from other than [facility] providers in  the past year (date may be approximate).     Assessment:   This is a routine wellness examination for Minh.  I connected with Liticia today by telephone and verified that I am speaking with the correct person using two identifiers. Location patient: home Location provider: work Persons participating in the virtual visit: patient, Marine scientist.    I discussed the limitations, risks, security and privacy concerns of performing an evaluation and management service by telephone and the availability of in person appointments. The patient expressed understanding and verbally consented to this telephonic visit.    Interactive audio and video telecommunications were attempted between this provider and patient, however failed, due to patient having technical difficulties OR patient did not have access to video capability.  We continued and completed visit with audio only.  Some vital signs may be absent or patient reported.   Hearing/Vision screen  Hearing Screening   125Hz  250Hz  500Hz  1000Hz  2000Hz  3000Hz  4000Hz  6000Hz  8000Hz   Right ear:           Left ear:           Comments: Patient is able to hear conversational tones without difficulty.  No issues reported.  Vision Screening Comments: Visual acuity not assessed.  Virtual visit.  Followed by their ophthalmologist every 6 months. Left eye cornea transplant.  Dietary issues and exercise activities discussed: Current Exercise Habits: Home exercise routine, Intensity: Mild  Heart healthy/low sodium diet Good water intake   Goals    . Follow up with Primary Care Provider     As needed      Depression Screen PHQ  2/9 Scores 04/13/2020 11/06/2019 05/07/2019 04/08/2019 01/06/2019  PHQ - 2 Score 0 0 0 0 0    Fall Risk Fall Risk  04/13/2020 11/06/2019 05/07/2019 04/08/2019 01/06/2019  Falls in the past year? 0 0 0 0 0  Number falls in past yr: 0 0 - - -  Injury with Fall? 0 0 - - -  Follow up Falls evaluation completed Falls evaluation completed -  Falls evaluation completed -    FALL RISK PREVENTION PERTAINING TO THE HOME: Handrails in use when climbing stairs? Yes Home free of loose throw rugs in walkways, pet beds, electrical cords, etc? Yes  Adequate lighting in your home to reduce risk of falls? Yes   ASSISTIVE DEVICES UTILIZED TO PREVENT FALLS: Life alert? No  Use of a cane, walker or w/c? No   Grab bars in the bathroom? Yes   Shower chair or bench in shower? No  Elevated toilet seat? No   TIMED UP AND GO: Was the test performed? No . Virtual visit.   Cognitive Function:     6CIT Screen 04/13/2020 04/08/2019  What Year? 0 points 0 points  What month? 0 points 0 points  What time? 0 points 0 points  Count back from 20 0 points 0 points  Months in reverse 0 points 0 points  Repeat phrase 0 points 0 points  Total Score 0 0    Immunizations Immunization History  Administered Date(s) Administered  . Janssen (J&J) SARS-COV-2 Vaccination 08/21/2019, 03/01/2020    TDAP status: Due, Education has been provided regarding the importance of this vaccine. Advised may receive this vaccine at local pharmacy or Health Dept. Aware to provide a copy of the vaccination record if obtained from local pharmacy or Health Dept. Verbalized acceptance and understanding. Deferred.   Pneumococcal vaccine status: Declined,  Education has been provided regarding the importance of this vaccine but patient still declined. Advised may receive this vaccine at local pharmacy or Health Dept. Aware to provide a copy of the vaccination record if obtained from local pharmacy or Health Dept. Verbalized acceptance and understanding.  Deferred.   Health Maintenance Health Maintenance  Topic Date Due  . INFLUENZA VACCINE  06/23/2020 (Originally 10/25/2019)  . TETANUS/TDAP  04/13/2021 (Originally 07/13/1959)  . Hepatitis C Screening  04/13/2021 (Originally 1941/03/11)  . PNA vac Low Risk Adult (1 of 2 - PCV13) 04/13/2021 (Originally 07/14/2007)  . DEXA SCAN   Completed  . COVID-19 Vaccine  Completed   Colonoscopy- 12/10/19.   Mammogram- plans to schedule later in the season.   Bone Density status: Completed 03/11/19. Results reflect: Bone density results: OSTEOPOROSIS. Repeat every 2 years. Prolia injection every 6 months.   Lung Cancer Screening: (Low Dose CT Chest recommended if Age 40-80 years, 30 pack-year currently smoking OR have quit w/in 15years.) does not qualify.   Hepatitis C Screening: does not qualify.  Vision Screening: Recommended annual ophthalmology exams for early detection of glaucoma and other disorders of the eye. Is the patient up to date with their annual eye exam?  Yes   Dental Screening: Recommended annual dental exams for proper oral hygiene. Visits every 6 months.   Community Resource Referral / Chronic Care Management: CRR required this visit?  No   CCM required this visit?  No      Plan:   Keep all routine maintenance appointments.   I have personally reviewed and noted the following in the patient's chart:   . Medical and social history . Use of alcohol, tobacco  or illicit drugs  . Current medications and supplements . Functional ability and status . Nutritional status . Physical activity . Advanced directives . List of other physicians . Hospitalizations, surgeries, and ER visits in previous 12 months . Vitals . Screenings to include cognitive, depression, and falls . Referrals and appointments  In addition, I have reviewed and discussed with patient certain preventive protocols, quality metrics, and best practice recommendations. A written personalized care plan for preventive services as well as general preventive health recommendations were provided to patient via mail.     Varney Biles, LPN   [id number]

## 2020-04-26 ENCOUNTER — Other Ambulatory Visit: Payer: Self-pay | Admitting: Internal Medicine

## 2020-06-15 ENCOUNTER — Other Ambulatory Visit: Payer: Self-pay | Admitting: Internal Medicine

## 2020-06-16 ENCOUNTER — Telehealth: Payer: Self-pay | Admitting: Internal Medicine

## 2020-06-16 ENCOUNTER — Ambulatory Visit (INDEPENDENT_AMBULATORY_CARE_PROVIDER_SITE_OTHER): Payer: Medicare Other | Admitting: Internal Medicine

## 2020-06-16 ENCOUNTER — Encounter: Payer: Self-pay | Admitting: Internal Medicine

## 2020-06-16 ENCOUNTER — Other Ambulatory Visit: Payer: Self-pay

## 2020-06-16 ENCOUNTER — Ambulatory Visit (INDEPENDENT_AMBULATORY_CARE_PROVIDER_SITE_OTHER): Payer: Medicare Other

## 2020-06-16 VITALS — BP 172/84 | HR 86 | Temp 97.6°F | Ht 59.0 in | Wt 135.0 lb

## 2020-06-16 DIAGNOSIS — I10 Essential (primary) hypertension: Secondary | ICD-10-CM | POA: Diagnosis not present

## 2020-06-16 DIAGNOSIS — R7303 Prediabetes: Secondary | ICD-10-CM | POA: Diagnosis not present

## 2020-06-16 DIAGNOSIS — Z13818 Encounter for screening for other digestive system disorders: Secondary | ICD-10-CM

## 2020-06-16 DIAGNOSIS — R0602 Shortness of breath: Secondary | ICD-10-CM

## 2020-06-16 DIAGNOSIS — E039 Hypothyroidism, unspecified: Secondary | ICD-10-CM | POA: Diagnosis not present

## 2020-06-16 DIAGNOSIS — M8000XD Age-related osteoporosis with current pathological fracture, unspecified site, subsequent encounter for fracture with routine healing: Secondary | ICD-10-CM

## 2020-06-16 DIAGNOSIS — H6122 Impacted cerumen, left ear: Secondary | ICD-10-CM

## 2020-06-16 DIAGNOSIS — Z1389 Encounter for screening for other disorder: Secondary | ICD-10-CM

## 2020-06-16 DIAGNOSIS — R0989 Other specified symptoms and signs involving the circulatory and respiratory systems: Secondary | ICD-10-CM

## 2020-06-16 LAB — COMPREHENSIVE METABOLIC PANEL
ALT: 14 U/L (ref 0–35)
AST: 19 U/L (ref 0–37)
Albumin: 4.2 g/dL (ref 3.5–5.2)
Alkaline Phosphatase: 63 U/L (ref 39–117)
BUN: 17 mg/dL (ref 6–23)
CO2: 30 mEq/L (ref 19–32)
Calcium: 9.4 mg/dL (ref 8.4–10.5)
Chloride: 102 mEq/L (ref 96–112)
Creatinine, Ser: 0.97 mg/dL (ref 0.40–1.20)
GFR: 56.2 mL/min — ABNORMAL LOW (ref >60.00)
Glucose, Bld: 113 mg/dL — ABNORMAL HIGH (ref 70–99)
Potassium: 4.1 mEq/L (ref 3.5–5.1)
Sodium: 138 mEq/L (ref 135–145)
Total Bilirubin: 0.6 mg/dL (ref 0.2–1.2)
Total Protein: 7.5 g/dL (ref 6.0–8.3)

## 2020-06-16 LAB — CBC WITH DIFFERENTIAL/PLATELET
Basophils Absolute: 0 10*3/uL (ref 0.0–0.1)
Basophils Relative: 0.6 % (ref 0.0–3.0)
Eosinophils Absolute: 0.2 10*3/uL (ref 0.0–0.7)
Eosinophils Relative: 2.6 % (ref 0.0–5.0)
HCT: 41.1 % (ref 36.0–46.0)
Hemoglobin: 13.7 g/dL (ref 12.0–15.0)
Lymphocytes Relative: 21.8 % (ref 12.0–46.0)
Lymphs Abs: 1.6 10*3/uL (ref 0.7–4.0)
MCHC: 33.3 g/dL (ref 30.0–36.0)
MCV: 96.1 fl (ref 78.0–100.0)
Monocytes Absolute: 0.5 10*3/uL (ref 0.1–1.0)
Monocytes Relative: 7.4 % (ref 3.0–12.0)
Neutro Abs: 4.8 10*3/uL (ref 1.4–7.7)
Neutrophils Relative %: 67.6 % (ref 43.0–77.0)
Platelets: 236 10*3/uL (ref 150.0–400.0)
RBC: 4.28 Mil/uL (ref 3.87–5.11)
RDW: 13.5 % (ref 11.5–15.5)
WBC: 7.1 10*3/uL (ref 4.0–10.5)

## 2020-06-16 LAB — HEMOGLOBIN A1C: Hgb A1c MFr Bld: 6.1 % (ref 4.6–6.5)

## 2020-06-16 LAB — LIPID PANEL
Cholesterol: 133 mg/dL (ref 0–200)
HDL: 59.2 mg/dL (ref >39.00)
LDL Cholesterol: 56 mg/dL (ref 0–99)
NonHDL: 73.58
Total CHOL/HDL Ratio: 2
Triglycerides: 88 mg/dL (ref 0.0–149.0)
VLDL: 17.6 mg/dL (ref 0.0–40.0)

## 2020-06-16 LAB — TSH: TSH: 2.41 u[IU]/mL (ref 0.35–4.50)

## 2020-06-16 MED ORDER — DENOSUMAB 60 MG/ML ~~LOC~~ SOSY
60.0000 mg | PREFILLED_SYRINGE | SUBCUTANEOUS | Status: DC
  Administered 2020-06-16 – 2021-02-21 (×2): 60 mg via SUBCUTANEOUS

## 2020-06-16 NOTE — Addendum Note (Signed)
Addended by: Leeanne Rio on: 06/16/2020 09:24 AM   Modules accepted: Orders

## 2020-06-16 NOTE — Telephone Encounter (Signed)
Patient informed and verbalized understanding.  Changed appointment to 7:30 am per Patient request so that she can have early labs

## 2020-06-16 NOTE — Telephone Encounter (Signed)
I think this was supposed to come to you

## 2020-06-16 NOTE — Telephone Encounter (Signed)
Patient is schedule for a prolia inj on 9/27

## 2020-06-16 NOTE — Telephone Encounter (Signed)
Please advise, Patient last had labs today

## 2020-06-16 NOTE — Patient Instructions (Addendum)
Call me back if you want to try albuterol inhaler    Shortness of Breath, Adult Shortness of breath is when a person has trouble breathing enough air or when a person feels like she or he is having trouble breathing in enough air. Shortness of breath could be a sign of a medical problem. Follow these instructions at home:  Pay attention to any changes in your symptoms.  Do not use any products that contain nicotine or tobacco, such as cigarettes, e-cigarettes, and chewing tobacco.  Do not smoke. Smoking is a common cause of shortness of breath. If you need help quitting, ask your health care provider.  Avoid things that can irritate your airways, such as: ? Mold. ? Dust. ? Air pollution. ? Chemical fumes. ? Things that can cause allergy symptoms (allergens), if you have allergies.  Keep your living space clean and free of mold and dust.  Rest as needed. Slowly return to your usual activities.  Take over-the-counter and prescription medicines only as told by your health care provider. This includes oxygen therapy and inhaled medicines.  Keep all follow-up visits as told by your health care provider. This is important.   Contact a health care provider if:  Your condition does not improve as soon as expected.  You have a hard time doing your normal activities, even after you rest.  You have new symptoms. Get help right away if:  Your shortness of breath gets worse.  You have shortness of breath when you are resting.  You feel light-headed or you faint.  You have a cough that is not controlled with medicines.  You cough up blood.  You have pain with breathing.  You have pain in your chest, arms, shoulders, or abdomen.  You have a fever.  You cannot walk up stairs or exercise the way that you normally do. These symptoms may represent a serious problem that is an emergency. Do not wait to see if the symptoms will go away. Get medical help right away. Call your local  emergency services (911 in the [place] Do not drive yourself to the hospital. Summary  Shortness of breath is when a person has trouble breathing enough air. It can be a sign of a medical problem.  Avoid things that irritate your lungs, such as smoking, pollution, mold, and dust.  Pay attention to changes in your symptoms and contact your health care provider if you have a hard time completing daily activities because of shortness of breath. This information is not intended to replace advice given to you by your health care provider. Make sure you discuss any questions you have with your health care provider. Document Revised: 08/12/2017 Document Reviewed: 08/12/2017 Elsevier Patient Education  2021 [employer].

## 2020-06-16 NOTE — Telephone Encounter (Signed)
Patient wanted to know if she need to have lab before her visit or at her visit on 11/29

## 2020-06-16 NOTE — Telephone Encounter (Signed)
We can do fasting labs on same day as visit

## 2020-06-16 NOTE — Progress Notes (Addendum)
Chief Complaint  Patient presents with  . Follow-up    F/u; prolia injection   F/u  1. prolia injection today  2. htn on coreg 12.5 mg bid, losartan 50 mg qd not had today fasting labs today 3. 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 C/o sob with weather change and h/o asthma echo 08/2019 negative no etiology  5. Left ear hearing reduced with wax  Review of Systems  Constitutional: Negative for weight loss.  HENT: Positive for hearing loss.   Eyes: Negative for blurred vision.  Respiratory: Negative for shortness of breath.   Cardiovascular: Negative for chest pain.  Gastrointestinal: Negative for abdominal pain.  Musculoskeletal: Negative for falls and joint pain.  Skin: Negative for rash.  Neurological: Negative for headaches.  Psychiatric/Behavioral: Negative for depression.   Past Medical History:  Diagnosis Date  . Asthma    childhood  . Chicken pox   . Chronic back pain    Said spontaneous thoracic and lumbar spine fractures secondary to falls.  . Colon polyps   . Coronary artery disease involving native coronary artery without angina pectoris 07/2016   In setting of inferior STEMI, cardiac cath showed occluded RCA.  Too small for PCI  . Essential hypertension   . Glaucoma   . 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  Injured her face. ->  Both aspirin and Plavix were stopped. ->  No residual findings  . Hearing loss   . 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 (no stents)   . Hyperlipidemia with target LDL less than 70    On rosuvastatin  . Hypothyroidism (acquired)    On Synthroid  . Intracranial hemorrhage ([place])    s/p fall years ago when on ? plavix so this was stopped   . Multiple fracture    x 5 vertebrae   . Urinary incontinence   . UTI (urinary tract  infection)    Past Surgical History:  Procedure Laterality Date  . ABDOMINAL HYSTERECTOMY     2/2 endometriosis and DUB  . BLADDER REPAIR    . cataract surgery     b/l  . CESAREAN SECTION    . COLONOSCOPY WITH PROPOFOL N/A 12/10/2019   Procedure: COLONOSCOPY WITH PROPOFOL;  Surgeon: Jonathon Bellows, MD;  Location: [facility] ENDOSCOPY;  Service: Gastroenterology;  Laterality: N/A;  . 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 Sabra Heck in [facility]  . CRANIOTOMY     SAH evaccuation  . RIGHT AND LEFT HEART CATH  07/2016   Inferior STEMI.  Culprit lesion mRPDA = was too small to address.  20% pLAD.  LCx-LPL normal.  Patent.  RCA ostial 30% RCA dominant.  Hazy plaque rupture and a small 1.5 mm PDA. --> LV Gram EF 50%, Inf HK. Increased LVEDP  . TONSILLECTOMY    . TRANSTHORACIC ECHOCARDIOGRAM  11/2015   Right is being within normal limits.   Family History  Problem Relation Age of Onset  . Other Mother        Old age  . Hearing loss Mother   . Cancer Father        colon died in 42  . CVA Father   . Lupus Sister   . Lung cancer Brother   . Cancer Brother  lung smoker  . Heart disease Maternal Grandmother   . Dementia Sister   . Colon cancer Brother   . Cancer Brother        colon  . Cancer Son        testicular cancer dx age 22 now 40 as of 12/2018   . Cancer Sister        skin   Social History   Socioeconomic History  . Marital status: Single    Spouse name: Not on file  . Number of children: 2  . Years of education: Not on file  . Highest education level: High school graduate  Occupational History  . Occupation: Nanny  Tobacco Use  . Smoking status: Never Smoker  . Smokeless tobacco: Never Used  Vaping Use  . Vaping Use: Never used  Substance and Sexual Activity  . Alcohol use: Never  . Drug use: Never  . Sexual activity: Not Currently  Other Topics Concern  . Not on file  Social History Narrative   Recently moved from [place], [place].->   Now lives in [place], [place]   Lives alone, but very close to her son Fara Olden and his wife.   Her daughter lives in [place], [place]   She has 3 grandchildren.   widow      She drinks coffee.     She does water aerobics/pool exercises.      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  Prior to moving to [place], she was the primary caregiver for her brother who passed away in 2017-05-18.  Once he died, her children convinced her to move up to [place] to be near them.      6 other siblings 2 1/2 siblings but those 1/2 siblings died in young age   Social Determinants of Health   Financial Resource Strain: Low Risk   . Difficulty of Paying Living Expenses: Not hard at all  Food Insecurity: No Food Insecurity  . Worried About Charity fundraiser in the Last Year: Never true  . Ran Out of Food in the Last Year: Never true  Transportation Needs: No Transportation Needs  . Lack of Transportation (Medical): No  . Lack of Transportation (Non-Medical): No  Physical Activity: Not on file  Stress: No Stress Concern Present  . Feeling of Stress : Not at all  Social Connections: Unknown  . Frequency of Communication with Friends and Family: More than three times a week  . Frequency of Social Gatherings with Friends and Family: More than three times a week  . Attends Religious Services: Not on file  . Active Member of Clubs or Organizations: Not on file  . Attends Archivist Meetings: Not on file  . Marital Status: Widowed  Intimate Partner Violence: Not At Risk  . Fear of Current or Ex-Partner: No  . Emotionally Abused: No  . Physically Abused: No  . Sexually Abused: No   Current Meds  Medication Sig  . aspirin 81 MG chewable tablet Chew by mouth daily.  . Calcium Carb-Cholecalciferol (CALCIUM 1000 + D PO) Take by mouth 2 (two) times daily.  . carvedilol (COREG) 12.5 MG tablet Take 1 tablet (12.5 mg total) by mouth 2 (two) times daily with a meal.  . losartan (COZAAR) 50 MG tablet Take 1 tablet (50 mg  total) by mouth 2 (two) times daily.  Marland Kitchen LOTEMAX 0.5 % ophthalmic suspension 2 (two) times daily.   . rosuvastatin (CRESTOR) 20 MG tablet TAKE 1 TABLET  DAILY  . SYNTHROID 88 MCG tablet TAKE 1 TABLET DAILY BEFORE BREAKFAST   Current Facility-Administered Medications for the 06/16/20 encounter (Office Visit) with McLean-Scocuzza, [name], MD  Medication  . denosumab (PROLIA) injection 60 mg   Allergies  Allergen Reactions  . Chocolate     H/a   . Lactose Intolerance (Gi)    No results found for this or any previous visit (from the past 2160 hour(s)). Objective  Body mass index is 27.27 kg/m. Wt Readings from Last 3 Encounters:  06/16/20 135 lb (61.2 kg)  04/13/20 146 lb (66.2 kg)  12/31/19 146 lb 8 oz (66.5 kg)   Temp Readings from Last 3 Encounters:  06/16/20 97.6 F (36.4 C)  12/10/19 98.2 F (36.8 C) (Temporal)  12/02/19 98.1 F (36.7 C) (Oral)   BP Readings from Last 3 Encounters:  06/16/20 (!) 172/84  12/31/19 138/80  12/10/19 (!) 129/94   Pulse Readings from Last 3 Encounters:  06/16/20 86  12/31/19 62  12/10/19 72    Physical Exam Vitals and nursing note reviewed.  Constitutional:      Appearance: Normal appearance. She is well-developed, well-groomed and overweight.  HENT:     Head: Normocephalic and atraumatic.     Left Ear: There is impacted cerumen.  Eyes:     Conjunctiva/sclera: Conjunctivae normal.     Pupils: Pupils are equal, round, and reactive to light.  Cardiovascular:     Rate and Rhythm: Normal rate and regular rhythm.     Heart sounds: Normal heart sounds. No murmur heard.   Pulmonary:     Effort: Pulmonary effort is normal.     Breath sounds: Normal breath sounds.  Abdominal:     Tenderness: There is no abdominal tenderness.  Skin:    General: Skin is warm and dry.  Neurological:     General: No focal deficit present.     Mental Status: She is alert and oriented to person, place, and time. Mental status is at baseline.     Gait:  Gait normal.  Psychiatric:        Attention and Perception: Attention and perception normal.        Mood and Affect: Mood and affect normal.        Speech: Speech normal.        Behavior: Behavior normal. Behavior is cooperative.        Thought Content: Thought content normal.        Cognition and Memory: Cognition and memory normal.        Judgment: Judgment normal.     Assessment  Plan  Osteoporosis with current pathological fracture with routine healing, unspecified osteoporosis type, subsequent encounter - Plan: denosumab (PROLIA) injection 60 mg today    SOB (shortness of breath) - Plan: DG Chest 2 View EXAM: CHEST - 2 VIEW  COMPARISON:  None.  FINDINGS: Normal heart size. Normal mediastinal contour. No pneumothorax. No pleural effusion. Hyperinflated lungs. Minimal curvilinear scarring versus atelectasis at the left lung base. No pulmonary edema. No acute consolidative airspace disease.  IMPRESSION: 1. Minimal curvilinear scarring versus atelectasis at the left lung base. 2. Hyperinflated lungs, cannot exclude obstructive lung disease.   Electronically Signed   By: Ilona Sorrel M.D.   On: 06/17/2020 16:56  Echo nl 08/2019  Asthma hx declines inhaler for now Also f/u with cards   Hypertension, unspecified type cont had coreg 12.5 mg bid, losartan 50 mg qd   Impacted cerumen of left ear  Consented  and removed with currette only wax left ear only all was removed and hearing loss improved   HM Declines flu shotand other vaccinesprev J&J x 1, moderna 03/01/20 pna shot per pt had in 2016 ? Which one  Had zostervax at [facility] in [place] declines shingrix  Declines vaccinesI.e covid vx prev declined Tdap, prevnar   Mammogram12/16/20 negshc 06/29/20   Colonoscopywith h/o polypsin 2015 last age 55 did not do and may like to do stool kitin future  12/10/19 bx colonoscopy negative   S/p hysterectomywith [id number] DUB endometrosis out of age window    DEXA12/16/20 osteopenia on prolia had inj 06/16/20+  -DEXA 09/2015 T score -2.5 +compression fractures T12 and L1-4 ? Had 06/12/16 cant read copy h/o osteopenia/porosis with comp. fxsneed to get copy of dexa was on prolia last shot 04/2018  -prolia injection had 05/26/2018, 10/10/17, 04/01/17, 09/24/16  -she was also on forteo 08/07/16   Consider derm in futurereferral placed seen in 2022 normal exam  Former PCP Dr. Lubertha Basque in [place] [phone] (get copy of labs, vaccines, mammogram, colonoscopy, DEXA, imaging) release faxed -2nd request 01/30/2019  Reviewed records Dr. August Saucer I.e labs and Dr. Jimmye Norman  Compression fx T12, L1-4   Cards Dr. Glenetta Hew in [place]  Dr. Brigitte Pulse eye in [place]  Endocrine in [facility] [phone] Eye surgery in [facility] Dr. Warren Danes Dr. Adonis Housekeeper seen 06/08/20 f/u in 6 weeks    Provider: Dr. [name] McLean-Scocuzza-Internal Medicine

## 2020-06-17 LAB — URINALYSIS, ROUTINE W REFLEX MICROSCOPIC
Bilirubin, UA: NEGATIVE
Glucose, UA: NEGATIVE
Ketones, UA: NEGATIVE
Leukocytes,UA: NEGATIVE
Nitrite, UA: NEGATIVE
Protein,UA: NEGATIVE
RBC, UA: NEGATIVE
Specific Gravity, UA: 1.013 (ref 1.005–1.030)
Urobilinogen, Ur: 0.2 mg/dL (ref 0.2–1.0)
pH, UA: 7.5 (ref 5.0–7.5)

## 2020-06-17 LAB — HEPATITIS C ANTIBODY: Hep C Virus Ab: <0.1 s/co ratio (ref 0.0–0.9)

## 2020-06-29 ENCOUNTER — Ambulatory Visit
Admission: RE | Admit: 2020-06-29 | Discharge: 2020-06-29 | Disposition: A | Discharge status: Home / Self Care | Payer: Medicare Other | Source: Ambulatory Visit | Attending: Internal Medicine | Admitting: Internal Medicine

## 2020-06-29 ENCOUNTER — Other Ambulatory Visit: Payer: Self-pay

## 2020-06-29 DIAGNOSIS — Z1231 Encounter for screening mammogram for malignant neoplasm of breast: Secondary | ICD-10-CM | POA: Insufficient documentation

## 2020-07-01 ENCOUNTER — Encounter: Payer: Self-pay | Admitting: Physician Assistant

## 2020-07-01 ENCOUNTER — Other Ambulatory Visit: Payer: Self-pay

## 2020-07-01 ENCOUNTER — Ambulatory Visit (INDEPENDENT_AMBULATORY_CARE_PROVIDER_SITE_OTHER): Payer: Medicare Other | Admitting: Physician Assistant

## 2020-07-01 VITALS — BP 114/68 | HR 64 | Ht 59.0 in | Wt 147.0 lb

## 2020-07-01 DIAGNOSIS — I252 Old myocardial infarction: Secondary | ICD-10-CM

## 2020-07-01 DIAGNOSIS — I10 Essential (primary) hypertension: Secondary | ICD-10-CM | POA: Diagnosis not present

## 2020-07-01 DIAGNOSIS — E785 Hyperlipidemia, unspecified: Secondary | ICD-10-CM | POA: Diagnosis not present

## 2020-07-01 DIAGNOSIS — Z9289 Personal history of other medical treatment: Secondary | ICD-10-CM

## 2020-07-01 DIAGNOSIS — I2510 Atherosclerotic heart disease of native coronary artery without angina pectoris: Secondary | ICD-10-CM | POA: Diagnosis not present

## 2020-07-01 MED ORDER — LOSARTAN POTASSIUM 50 MG PO TABS: 50.0000 mg | ORAL_TABLET | Freq: Two times a day (BID) | ORAL | 3 refills | Status: DC

## 2020-07-01 MED ORDER — CARVEDILOL 12.5 MG PO TABS: 12.5000 mg | ORAL_TABLET | Freq: Two times a day (BID) | ORAL | 3 refills | Status: DC

## 2020-07-01 MED ORDER — ROSUVASTATIN CALCIUM 20 MG PO TABS: 20.0000 mg | ORAL_TABLET | Freq: Every day | ORAL | 3 refills | Status: DC

## 2020-07-01 NOTE — Patient Instructions (Signed)
Medication Instructions:  Your physician recommends that you continue on your current medications as directed. Please refer to the Current Medication list given to you today.  *If you need a refill on your cardiac medications before your next appointment, please call your pharmacy*   Lab Work: None ordered  Testing/Procedures: None ordered   Follow-Up: At [facility], you and your health needs are our priority.  As part of our continuing mission to provide you with exceptional heart care, we have created designated Provider Care Teams.  These Care Teams include your primary Cardiologist (physician) and Advanced Practice Providers (APPs -  Physician Assistants and Nurse Practitioners) who all work together to provide you with the care you need, when you need it.  We recommend signing up for the patient portal called "MyChart".  Sign up information is provided on this After Visit Summary.  MyChart is used to connect with patients for Virtual Visits (Telemedicine).  Patients are able to view lab/test results, encounter notes, upcoming appointments, etc.  Non-urgent messages can be sent to your provider as well.   To learn more about what you can do with MyChart, go to [email or web address].    Your next appointment:   6 month(s)  The format for your next appointment:   In Person  Provider:   You may see Dr. Fletcher Anon or one of the following Advanced Practice Providers on your designated Care Team:     Marrianne Mood, [place]

## 2020-07-01 NOTE — Progress Notes (Signed)
Office Visit    Patient Name: Amy Montes Date of Encounter: 07/01/2020  PCP:  McLean-Scocuzza, [name], [place]  Cardiologist:  Glenetta Hew, MD Dr. Fletcher Anon Advanced Practice Provider:  No care team member to display Electrophysiologist:  None :[phone]}   Chief Complaint    Chief Complaint  Patient presents with  . 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  Past Medical History    Past Medical History:  Diagnosis Date  . Asthma    childhood  . Chicken pox   . Chronic back pain    Said spontaneous thoracic and lumbar spine fractures secondary to falls.  . Colon polyps   . Coronary artery disease involving native coronary artery without angina pectoris 07/2016   In setting of inferior STEMI, cardiac cath showed occluded RCA.  Too small for PCI  . Essential hypertension   . Glaucoma   . 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  Injured her face. ->  Both aspirin and Plavix were stopped. ->  No residual findings  . Hearing loss   . 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 (no stents)   . Hyperlipidemia with target LDL less than 70    On rosuvastatin  . Hypothyroidism (acquired)    On Synthroid  . 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 plavix so this was stopped   . Multiple fracture    x 5 vertebrae   . Urinary incontinence   . UTI (urinary tract infection)    Past Surgical History:  Procedure Laterality Date   . ABDOMINAL HYSTERECTOMY     2/2 endometriosis and DUB  . BLADDER REPAIR    . cataract surgery     b/l  . CESAREAN SECTION    . COLONOSCOPY WITH PROPOFOL N/A 12/10/2019   Procedure: COLONOSCOPY WITH PROPOFOL;  Surgeon: Jonathon Bellows, MD;  Location: [facility] ENDOSCOPY;  Service: Gastroenterology;  Laterality: N/A;  . 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 Sabra Heck in [facility]  . CRANIOTOMY     SAH evaccuation  . RIGHT AND LEFT HEART CATH  07/2016   Inferior STEMI.  Culprit lesion mRPDA = was too small to address.  20% pLAD.  LCx-LPL normal.  Patent.  RCA ostial 30% RCA dominant.  Hazy plaque rupture and a small 1.5 mm PDA. --> LV Gram EF 50%, Inf HK. Increased LVEDP  . TONSILLECTOMY    . TRANSTHORACIC ECHOCARDIOGRAM  11/2015   Right is being within normal limits.    Allergies  Allergies  Allergen Reactions  . Chocolate     H/a   . Lactose Intolerance (Gi)     History of Present Illness    Amy Montes is a 80 y.o. female with PMH as above.    Her initial cardiac care was in [place].  She reports moving up from [place] to [place] to be with her kids (2 children).  She lives in [place], [place].  She denies any history of tobacco use, though she does note that her husband smoked for 30 years.  She had frequent sinus infections and respiratory issues while living with him.  After they forced, and she moved out, she noted improvement of her respiratory issues and sinus infection.    She reports a fall 6 to 7 years ago, during which time she fractured 5 vertebrae.  She notes pain due to this, as well as kyphosis. She has had issues with her left eye after failed corneal transplant.  She has history of CAD s/p inferior ST elevation myocardial infarction and hospitalization 07/2016.  Cardiac catheterization showed plaque rupture to the right PDA, felt to be too small for angioplasty.  Cath showed 20% proximal LAD stenosis, 30% ostial RCA stenosis and  ruptured plaque in the small right PDA.  EF 50% with inferior wall hypokinesis.  LVEDP mildly elevated at 26 mmHg.  She was medically managed.  After worsening exertional dyspnea, 08/2019 echo performed with EF 60 to 16%, normal diastolic function and pulmonary pressures, and no significant valvular abnormalities.  She has a history of elevated blood pressure, attributed to whitecoat syndrome, given discrepancy between office and home readings.  Today, 07/01/2020, she returns to clinic and notes she is overall doing well from a cardiac standpoint. She reports she is still short of breath.  Her PCP got a chest x-ray with recommendation she follow-up with pulmonology.  We reviewed the chest x-ray together.  As above, she does have a history of smoke exposure via her husband.  She intends to follow-up with pulmonology as recommended.  She reports that she still remains very active around the house.  She does her own housework.  She is working to cut back on sweets.  She has no regular exercise routine, but she does note helping her children install a heated pool.  At the start of May, she hopes to start using it for regular exercise.  No reported chest pain, racing heart rate, palpitations, presyncope, syncope.  No recurrent falls. She has occasional dependent lower extremity edema and shortness of breath as above.  No PND, early satiety, abdominal distention.  She reports that she sleeps propped up, but this is due to comfort and not orthopnea.  She reports medication compliance.  No signs or symptoms of bleeding.  Home Medications    Current Outpatient Medications on File Prior to Visit  Medication Sig Dispense Refill  . aspirin 81 MG chewable tablet Chew by mouth daily.    . carvedilol (COREG) 12.5 MG tablet Take 1 tablet (12.5 mg total) by mouth 2 (two) times daily with a meal. 180 tablet 3  . losartan (COZAAR) 50 MG tablet Take 1 tablet (50 mg total) by mouth 2 (two) times daily. 180 tablet 3  . LOTEMAX  0.5 % ophthalmic suspension 2 (two) times daily.     . rosuvastatin (CRESTOR) 20 MG tablet TAKE 1 TABLET DAILY 90 tablet 3  . SYNTHROID 88 MCG tablet TAKE 1 TABLET DAILY BEFORE BREAKFAST 90 tablet 3   Current Facility-Administered Medications on File Prior to Visit  Medication Dose Route Frequency Provider Last Rate Last Admin  . denosumab (PROLIA) injection 60 mg  60 mg Subcutaneous Q6 months McLean-Scocuzza, [name], MD   60 mg at 06/16/20 [id number]    Review of Systems    She reports shortness of breath and dependent lower extremity edema.  She reports a fall 6 to 7 years ago and without recurrence.  She  denies chest pain, palpitations, pnd, orthopnea, n, v, dizziness, syncope, weight gain, or early satiety.  She props herself up to sleep at night for comfort only.  All other systems reviewed and are otherwise negative except as noted above.  Physical Exam    VS:  BP 114/68   Pulse 64   Ht 4\' 11"  (1.499 m)   Wt 147 lb (66.7 kg)   BMI 29.69 kg/m  , BMI Body mass index is 29.69 kg/m. GEN: Well nourished, well developed, in no acute distress. HEENT: normal. Neck: Supple, no JVD, carotid bruits, or masses. Cardiac: RRR, no murmurs, rubs, or gallops. No clubbing, cyanosis.mild bilateral nonpitting edema.  Radials/DP/PT 2+ and equal bilaterally.  Respiratory:  Respirations regular and unlabored, bilateral reduced breath sounds  GI: Soft, nontender, nondistended, BS + x 4.  MS: no deformity or atrophy. Skin: warm and dry, no rash. Neuro:  Strength and sensation are intact. Psych: Normal affect.  Accessory Clinical Findings    ECG personally reviewed by me today -NSR, 64 bpm Q waves in inferior 2, 3, aVF- no acute changes.  VITALS Reviewed today   Temp Readings from Last 3 Encounters:  06/16/20 97.6 F (36.4 C)  12/10/19 98.2 F (36.8 C) (Temporal)  12/02/19 98.1 F (36.7 C) (Oral)   BP Readings from Last 3 Encounters:  07/01/20 114/68  06/16/20 (!) 172/84  12/31/19 138/80    Pulse Readings from Last 3 Encounters:  07/01/20 64  06/16/20 86  12/31/19 62    Wt Readings from Last 3 Encounters:  07/01/20 147 lb (66.7 kg)  06/16/20 135 lb (61.2 kg)  04/13/20 146 lb (66.2 kg)   [place] - 146lbs 12/2019  LABS  reviewed today   Lab Results  Component Value Date   WBC 7.1 06/16/2020   HGB 13.7 06/16/2020   HCT 41.1 06/16/2020   MCV 96.1 06/16/2020   PLT 236.0 06/16/2020   Lab Results  Component Value Date   CREATININE 0.97 06/16/2020   BUN 17 06/16/2020   NA 138 06/16/2020   K 4.1 06/16/2020   CL 102 06/16/2020   CO2 30 06/16/2020   Lab Results  Component Value Date   ALT 14 06/16/2020   AST 19 06/16/2020   ALKPHOS 63 06/16/2020   BILITOT 0.6 06/16/2020   Lab Results  Component Value Date   CHOL 133 06/16/2020   HDL 59.20 06/16/2020   LDLCALC 56 06/16/2020   TRIG 88.0 06/16/2020   CHOLHDL 2 06/16/2020    Lab Results  Component Value Date   HGBA1C 6.1 06/16/2020   Lab Results  Component Value Date   TSH 2.41 06/16/2020     STUDIES/PROCEDURES reviewed today   Echo 08/2019 1. Left ventricular ejection fraction, by estimation, is 60 to 65%. The  left ventricle has normal function. The left ventricle has no regional  wall motion abnormalities. There is mild left ventricular hypertrophy.  Left ventricular diastolic parameters  were normal.  2. Right ventricular systolic function is normal. The right ventricular  size is normal. There is normal pulmonary artery systolic pressure.  3. The mitral valve is normal in structure. Mild mitral valve  regurgitation. No evidence of mitral stenosis.  4. The aortic valve is normal in structure. Aortic valve regurgitation is  not visualized. No aortic stenosis is present.  5. The inferior vena cava is normal in size with <50% respiratory  variability, suggesting right atrial pressure of 8 mmHg.   Comparison(s): Echo in [place] in 2017 reported to  show normal LV systolic  function.    Assessment & Plan    Coronary artery disease History of inferior ST elevation myocardial infarction -Denies CP and reports stable DOE.  EKG without acute ST/T changes.  She has history of inferior STEMI as above with last cath recommendation for medical management.  Most recent EF normal. Continue medical therapy with ASA 81 mg daily, Coreg, losartan, statin.  Discussed heart healthy diet and increase of activity with patient eager to start using her children's heated pool soon for exercise.  She is also working to cut down on sweets.  HTN, goal BP <130/80  --BP well controlled to somewhat soft today at 114/68.  She denies any associated dizziness with lower BP today.  Continue current losartan 50 mg and Coreg 12.5 mg twice daily.  HLD, goal LDL <70 --06/16/2020 LDL at goal and 56.  Continue current rosuvastatin 20 mg daily.  CXR Findings, per PCP/pulmonology --06/17/2020 chest x-ray performed with minimal curvilinear scarring versus atelectasis of the left lower and hyperinflated lungs -unable to exclude obstructive lung disease. Consider as contributing to her previously reported chronic dyspnea. Recommend follow-up with pulmonology as recommended by PCP.     Disposition: RTC 6 months    Arvil Chaco, PA-C 07/01/2020

## 2020-07-03 DIAGNOSIS — R0989 Other specified symptoms and signs involving the circulatory and respiratory systems: Secondary | ICD-10-CM

## 2020-07-03 HISTORY — DX: Other specified symptoms and signs involving the circulatory and respiratory systems: R09.89

## 2020-07-03 NOTE — Addendum Note (Signed)
Addended by: Orland Mustard on: 07/03/2020 09:44 PM   Modules accepted: Orders

## 2020-08-29 ENCOUNTER — Ambulatory Visit (INDEPENDENT_AMBULATORY_CARE_PROVIDER_SITE_OTHER): Payer: Medicare Other | Admitting: Pulmonary Disease

## 2020-08-29 ENCOUNTER — Other Ambulatory Visit: Payer: Self-pay

## 2020-08-29 ENCOUNTER — Encounter: Payer: Self-pay | Admitting: Pulmonary Disease

## 2020-08-29 VITALS — BP 130/72 | HR 65 | Temp 97.2°F | Ht 59.0 in | Wt 145.2 lb

## 2020-08-29 DIAGNOSIS — R9389 Abnormal findings on diagnostic imaging of other specified body structures: Secondary | ICD-10-CM | POA: Diagnosis not present

## 2020-08-29 DIAGNOSIS — M40204 Unspecified kyphosis, thoracic region: Secondary | ICD-10-CM | POA: Diagnosis not present

## 2020-08-29 DIAGNOSIS — R0602 Shortness of breath: Secondary | ICD-10-CM | POA: Diagnosis not present

## 2020-08-29 NOTE — Patient Instructions (Signed)
I think most of the changes on your chest x-ray are due to your back issues and age-related changes.  There may be some element of asthma.  We are checking oxygen level at nighttime to evaluate your episodes of waking up short of breath.  We are also checking breathing test that will tell us more about your lungs.  We will see you in follow-up in 3 months time call sooner should any new problems arise.

## 2020-08-29 NOTE — Progress Notes (Signed)
Subjective:    Patient ID: Amy Montes, female    DOB: 1940-05-08, 80 y.o.   MRN: [id number] Chief Complaint  Patient presents with  . pulmonary consult    Hx of childhood asthma- c/o occ sob with exertion. Recent CXR    HPI Amy Montes is a 80 year old lifelong never smoker who presents for evaluation of occasional shortness of breath and hyperinflation noted on chest x-ray.  She is kindly referred by Dr. [name] Mclean-Scocuzza.  The patient has noted some occasional shortness of breath particularly during the winter months and exposure to cold air.  This started occurring around the winter 2021 through early 2022..  She previously resided in [place] and moved here in May 2020 and states that her initial winter here was not difficult.  This past winter however she did notice these changes.  They are transient and usually resolved by resting and getting warm.  She has not had any fevers, chills or sweats.  No cough or sputum production.  A chest x-ray was performed on 16 June 2020 and was read as having hyperinflation.  My review of the film however is more consistent with senescent changes and changes due to severe kyphosis.  The patient states that because of her kyphosis she has to sleep almost sitting up.  Occasionally if she slips down from the sitting position in bed, she will notice shortness of breath which usually resolves when she gets herself more upright.  She does have occasional reflux symptoms but these are not severe and usually resolve with Tums.  She states that she had a history of childhood asthma and bronchitis which cleared once she moved out to a warmer climate.    She has been a lifelong never smoker however her husband was a heavy smoker.  He was in [place] and she traveled extensively and moved frequently while she was married.  She has lived in [place], [place], [place], [place], [place], [place] and [place].  She has not resided out of the country.  She has not  had any exotic travel.  She has not had significant occupational exposure.  She states that she has had cardiac problems that where managed by [facility] in [place] with the records available she had a cardiac catheterization on 25 Jul 2016 in [place] after an inferior wall myocardial infarction with the mid posterior descending artery being the culprit but with small vessel disease.  LVEF during that cath was 50%.  2D echo performed 26 Jul 2016 showed preserved LVEF 55% mild MR, mild TR and diastolic dysfunction noted.  Mild elevation of right ventricular pressures.  Most recent echocardiogram confirms findings.  Review of Systems A 10 point review of systems was performed and it is as noted above otherwise negative.  Past Medical History:  Diagnosis Date  . Asthma    childhood  . Chicken pox   . Chronic back pain    Said spontaneous thoracic and lumbar spine fractures secondary to falls.  . Colon polyps   . Coronary artery disease involving native coronary artery without angina pectoris 07/2016   In setting of inferior STEMI, cardiac cath showed occluded RCA.  Too small for PCI  . Essential hypertension   . Glaucoma   . 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  Injured her face. ->  Both aspirin and Plavix were stopped. ->  No residual findings  . Hearing loss   . 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 (no stents)   . Hyperlipidemia with target LDL less than 70    On rosuvastatin  . Hypothyroidism (acquired)    On Synthroid  . Intracranial hemorrhage ([facility])    s/p fall years ago when on ? plavix so this was stopped   . Multiple fracture    x 5 vertebrae   . Urinary incontinence   . UTI (urinary tract infection)    Past Surgical History:  Procedure Laterality Date  . ABDOMINAL HYSTERECTOMY     2/2 endometriosis and DUB  . BLADDER REPAIR    . cataract surgery      b/l  . CESAREAN SECTION    . COLONOSCOPY WITH PROPOFOL N/A 12/10/2019   Procedure: COLONOSCOPY WITH PROPOFOL;  Surgeon: Jonathon Bellows, MD;  Location: [facility] ENDOSCOPY;  Service: Gastroenterology;  Laterality: N/A;  . 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 Sabra Heck in [facility]  . CRANIOTOMY     SAH evaccuation  . RIGHT AND LEFT HEART CATH  07/2016   Inferior STEMI.  Culprit lesion mRPDA = was too small to address.  20% pLAD.  LCx-LPL normal.  Patent.  RCA ostial 30% RCA dominant.  Hazy plaque rupture and a small 1.5 mm PDA. --> LV Gram EF 50%, Inf HK. Increased LVEDP  . TONSILLECTOMY    . TRANSTHORACIC ECHOCARDIOGRAM  11/2015   Right is being within normal limits.   Family History  Problem Relation Age of Onset  . Other Mother        Old age  . Hearing loss Mother   . Cancer Father        colon died in 53  . CVA Father   . Lupus Sister   . Lung cancer Brother   . Cancer Brother        lung smoker  . Heart disease Maternal Grandmother   . Dementia Sister   . Colon cancer Brother   . Cancer Brother        colon  . Cancer Son        testicular cancer dx age 54 now 29 as of 12/2018   . Cancer Sister        skin  . Breast cancer Neg Hx    Social History   Tobacco Use  . Smoking status: Never Smoker  . Smokeless tobacco: Never Used  Substance Use Topics  . Alcohol use: Never   Allergies  Allergen Reactions  . Chocolate     H/a   . Lactose Intolerance (Gi)    Current Meds  Medication Sig  . aspirin 81 MG chewable tablet Chew by mouth daily.  . carvedilol (COREG) 12.5 MG tablet Take 1 tablet (12.5 mg total) by mouth 2 (two) times daily with a meal.  . losartan (COZAAR) 50 MG tablet Take 1 tablet (50 mg total) by mouth 2 (two) times daily.  Marland Kitchen LOTEMAX 0.5 % ophthalmic suspension 2 (two) times daily.   . rosuvastatin (CRESTOR) 20 MG tablet Take 1 tablet (20 mg total) by mouth daily.  Marland Kitchen SYNTHROID 88 MCG tablet TAKE 1 TABLET DAILY BEFORE BREAKFAST    Current Facility-Administered Medications for the 08/29/20 encounter (Office Visit) with Tyler Pita, MD  Medication  . denosumab (PROLIA) injection 60 mg   Immunization History  Administered Date(s) Administered  . Janssen (J&J) SARS-COV-2 Vaccination 08/21/2019  . Moderna SARS-COV2 Booster Vaccination 03/01/2020  Objective:   Physical Exam BP 130/72 (BP Location: Left Arm, Cuff Size: Normal)   Pulse 65   Temp (!) 97.2 F (36.2 C) (Temporal)   Ht 4\' 11"  (1.499 m)   Wt 145 lb 3.2 oz (65.9 kg)   SpO2 97%   BMI 29.33 kg/m  GENERAL: Elderly woman, no acute distress, fully ambulatory, no conversational dyspnea. HEAD: Normocephalic, atraumatic.  EYES: Ptosis of the left eye.  No scleral icterus.  MOUTH: Nose/mouth/throat not examined due to masking requirements for COVID 19. NECK: Supple. No thyromegaly. Trachea midline. No JVD.  No adenopathy. PULMONARY: Good air entry bilaterally.  No adventitious sounds. CARDIOVASCULAR: S1 and S2. Regular rate and rhythm.  No rubs, murmurs or gallops heard. ABDOMEN: Benign. MUSCULOSKELETAL: Significant kyphosis.  No joint deformity, no clubbing, no edema.  NEUROLOGIC: No focal deficit with the exception of left eye ptosis, no gait disturbance, speech is fluent. SKIN: Intact,warm,dry.  On limited exam no rashes. PSYCH: Mood and behavior normal.  Chest x-ray performed 16 June 2020:       Assessment & Plan:     ICD-10-CM   1. Shortness of breath  R06.02 Pulse oximetry, overnight    Pulmonary Function Test [facility] Only   Suspect that her issues are related to restrictive physiology Restrictive physiology due to kyphosis Obtain PFTs to assess ONOX  2. Abnormal chest x-ray  R93.89    Suspect that findings are related to her severe kyphosis Senescent changes also play a part   3. Kyphosis of thoracic region, unspecified kyphosis type  M40.204    This issue adds complexity to her management Likely causes restrictive physiology  due to thoracic spine deformity   Orders Placed This Encounter  Procedures  . Pulse oximetry, overnight    On room air  DME:[facility]    Standing Status:   Future    Standing Expiration Date:   08/29/2021  . Pulmonary Function Test [facility] Only    Standing Status:   Future    Standing Expiration Date:   08/29/2021    Scheduling Instructions:     3 weeks    Order Specific Question:   Full PFT: includes the following: basic spirometry, spirometry pre & post bronchodilator, diffusion capacity (DLCO), lung volumes    Answer:   Full PFT    Patient's issues with dyspnea may be related to restrictive physiology from significant kyphosis.  She is having nocturnal awakenings and has evidence of diastolic dysfunction and mildly elevated right-sided pressures, will obtain overnight oximetry to evaluate for potential nocturnal hypoxemia during these episodes.   We will see the patient in follow-up in 3 months time she is to contact us prior to that time should any new difficulties arise.  Renold Don, MD [facility]   *This note was dictated using voice recognition software/Dragon.  Despite best efforts to proofread, errors can occur which can change the meaning.  Any change was purely unintentional.

## 2020-09-08 ENCOUNTER — Encounter: Payer: Self-pay | Admitting: Pulmonary Disease

## 2020-09-09 ENCOUNTER — Telehealth: Payer: Self-pay

## 2020-09-09 NOTE — Telephone Encounter (Signed)
Called and spoke with pt about upcoming Covid test.pt had a clear understanding.

## 2020-09-12 ENCOUNTER — Other Ambulatory Visit: Payer: Self-pay

## 2020-09-12 ENCOUNTER — Other Ambulatory Visit
Admission: RE | Admit: 2020-09-12 | Discharge: 2020-09-12 | Disposition: A | Discharge status: Home / Self Care | Payer: Medicare Other | Source: Ambulatory Visit | Attending: Pulmonary Disease | Admitting: Pulmonary Disease

## 2020-09-12 DIAGNOSIS — Z01812 Encounter for preprocedural laboratory examination: Secondary | ICD-10-CM | POA: Insufficient documentation

## 2020-09-12 DIAGNOSIS — Z20822 Contact with and (suspected) exposure to covid-19: Secondary | ICD-10-CM | POA: Insufficient documentation

## 2020-09-12 LAB — SARS CORONAVIRUS 2 (TAT 6-24 HRS): SARS Coronavirus 2: NEGATIVE

## 2020-09-13 ENCOUNTER — Ambulatory Visit: Payer: Medicare Other | Attending: Pulmonary Disease

## 2020-09-13 DIAGNOSIS — J984 Other disorders of lung: Secondary | ICD-10-CM | POA: Insufficient documentation

## 2020-09-13 DIAGNOSIS — R0602 Shortness of breath: Secondary | ICD-10-CM | POA: Insufficient documentation

## 2020-09-13 MED ORDER — ALBUTEROL SULFATE (2.5 MG/3ML) 0.083% IN NEBU
2.5000 mg | INHALATION_SOLUTION | Freq: Once | RESPIRATORY_TRACT | Status: AC
  Administered 2020-09-13: 2.5 mg via RESPIRATORY_TRACT
  Filled 2020-09-13: qty 3

## 2020-09-21 ENCOUNTER — Telehealth: Payer: Self-pay

## 2020-09-21 DIAGNOSIS — M40204 Unspecified kyphosis, thoracic region: Secondary | ICD-10-CM

## 2020-09-21 NOTE — Telephone Encounter (Signed)
Patient is aware of results and voiced her understanding.  She does not wish to do a daily inhaler. She stated that she is not uncomfortable enough to take on a daily inhaler at this time.  She will call back if she develops any new or worsen sx.   Routing to Dr. Patsey Berthold as an Juluis Rainier

## 2020-09-21 NOTE — Addendum Note (Signed)
Addended by: Claudette Head A on: 09/21/2020 10:41 AM   Modules accepted: Orders

## 2020-09-21 NOTE — Telephone Encounter (Signed)
I just reviewed her overnight oximetry and she will need oxygen at 2 L/min nocturnally.  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  This may help her with her better sleep and with shortness of breath during the day.

## 2020-09-21 NOTE — Telephone Encounter (Signed)
-----   Message from Tyler Pita, MD sent at 09/20/2020  5:47 PM EDT ----- Breathing test show that she does not take a deep breath this is likely due to her kyphosis (curvature of the spine) also has an element that is consistent with asthma.  We can give her a trial of Breo Ellipta to see if this helps some with her shortness of breath.

## 2020-09-21 NOTE — Telephone Encounter (Signed)
Patient is aware of results and voiced her understanding.  Patient is agreeable with oxygen.  Order has been placed.  Nothing further needed.

## 2020-11-01 ENCOUNTER — Telehealth: Payer: Self-pay | Admitting: Pulmonary Disease

## 2020-11-01 DIAGNOSIS — M40204 Unspecified kyphosis, thoracic region: Secondary | ICD-10-CM

## 2020-11-01 DIAGNOSIS — R0602 Shortness of breath: Secondary | ICD-10-CM

## 2020-11-01 NOTE — Telephone Encounter (Signed)
Called and spoke with patient to let her know that Dr. Patsey Berthold was ok with putting in the order to D/C oxygen if that's what she wanted to do. Patient stated that she would like the order placed and would like to get everything with her eye straightened out first and then reevaluate the situation. Order has been placed for DME. Nothing further needed at this time.

## 2020-11-01 NOTE — Telephone Encounter (Signed)
It is up to her.  We can write an order for [facility] to remove the oxygen due to patient preference.

## 2020-11-01 NOTE — Telephone Encounter (Signed)
Called and spoke with patient who states that she is having a hard time right now with her eye and her oxygen. She states that she cannot get used to it and that it is waking her up at night.  She states has a lot going on and plans to call [facility] to come pick up the equipment and that maybe when things settle down she will look into it again.  Dr. Patsey Berthold please advise

## 2020-11-15 ENCOUNTER — Telehealth: Payer: Self-pay | Admitting: Internal Medicine

## 2020-11-15 NOTE — Telephone Encounter (Signed)
Prolia Verification in process.

## 2020-11-17 ENCOUNTER — Telehealth: Payer: Self-pay | Admitting: Cardiovascular Disease

## 2020-11-17 NOTE — Telephone Encounter (Signed)
   Patient Name: Amy Montes  DOB: January 27, 1941 MRN: [id number]  Primary Cardiologist: Glenetta Hew, MD  Chart reviewed as part of pre-operative protocol coverage.   IF SIMPLE EXTRACTION/CLEANINGS (1-2 extractions): Simple dental extractions are considered low risk procedures per guidelines and generally do not require any specific cardiac clearance. It is also generally accepted that for simple extractions and dental cleanings, there is no need to interrupt blood thinner therapy.  IF MULTIPLE EXTRACTIONS OR COMPLEX DENTAL PROCEDURES (2 or greater extractions): If the procedure will entail two or greater extractions please let us know and we can update the clearance as necessary.   SBE prophylaxis is not required for the patient from a cardiac standpoint.  I will route this recommendation to the requesting party via Epic fax function and remove from pre-op pool.  Please call with questions.  Kathyrn Drown, NP 11/17/2020, 1:34 PM

## 2020-11-17 NOTE — Telephone Encounter (Signed)
   [facility] Pre-operative Risk Assessment    Patient Name: Amy Montes  DOB: March 09, 1941 MRN: [id number]  HEARTCARE STAFF:  - IMPORTANT!!!!!! Under Visit Info/Reason for Call, type in Other and utilize the format Clearance MM/DD/YY or Clearance TBD. Do not use dashes or single digits. - Please review there is not already an duplicate clearance open for this procedure. - If request is for dental extraction, please clarify the # of teeth to be extracted. - If the patient is currently at the dentist's office, call Pre-Op Callback Staff (MA/nurse) to input urgent request.  - If the patient is not currently in the dentist office, please route to the Pre-Op pool.  Request for surgical clearance:  What type of surgery is being performed? DENTAL CLEANING, RADIOGRAPHS, FILLINGS, CROWNS, BRIDGES, EXTRACTIONS  When is this surgery scheduled? TBD  What type of clearance is required (medical clearance vs. Pharmacy clearance to hold med vs. Both)? NOT LISTED  Are there any medications that need to be held prior to surgery and how long? NOT LISTED  Practice name and name of physician performing surgery? [facility], DR Patsey Berthold What is the office phone number?    7.   What is the office fax number? [phone] 8.   Anesthesia type (None, local, MAC, general) ? LOCAL   Eli Phillips 11/17/2020, 12:59 PM  _________________________________________________________________   (provider comments below)

## 2020-11-23 NOTE — Telephone Encounter (Signed)
Patient scheduled.

## 2020-11-25 NOTE — Telephone Encounter (Signed)
Spoke to [place] w/ [facility] who first advised they did not receive the order then looked at it again & said that they will get it taken care of & they will contact the patient.

## 2020-12-20 ENCOUNTER — Ambulatory Visit: Payer: Medicare Other

## 2020-12-28 DIAGNOSIS — H16402 Unspecified corneal neovascularization, left eye: Secondary | ICD-10-CM | POA: Insufficient documentation

## 2020-12-28 DIAGNOSIS — H16012 Central corneal ulcer, left eye: Secondary | ICD-10-CM | POA: Insufficient documentation

## 2021-02-21 ENCOUNTER — Ambulatory Visit: Payer: Medicare Other | Admitting: Internal Medicine

## 2021-02-21 ENCOUNTER — Encounter: Payer: Self-pay | Admitting: Internal Medicine

## 2021-02-21 ENCOUNTER — Other Ambulatory Visit: Payer: Self-pay

## 2021-02-21 ENCOUNTER — Ambulatory Visit (INDEPENDENT_AMBULATORY_CARE_PROVIDER_SITE_OTHER): Payer: Medicare Other | Admitting: Internal Medicine

## 2021-02-21 VITALS — BP 124/80 | HR 77 | Temp 97.5°F | Ht 59.0 in | Wt 142.2 lb

## 2021-02-21 DIAGNOSIS — M8000XD Age-related osteoporosis with current pathological fracture, unspecified site, subsequent encounter for fracture with routine healing: Secondary | ICD-10-CM | POA: Diagnosis not present

## 2021-02-21 DIAGNOSIS — M810 Age-related osteoporosis without current pathological fracture: Secondary | ICD-10-CM

## 2021-02-21 DIAGNOSIS — E039 Hypothyroidism, unspecified: Secondary | ICD-10-CM

## 2021-02-21 DIAGNOSIS — R7303 Prediabetes: Secondary | ICD-10-CM | POA: Diagnosis not present

## 2021-02-21 DIAGNOSIS — I10 Essential (primary) hypertension: Secondary | ICD-10-CM | POA: Diagnosis not present

## 2021-02-21 DIAGNOSIS — E785 Hyperlipidemia, unspecified: Secondary | ICD-10-CM

## 2021-02-21 DIAGNOSIS — Z1231 Encounter for screening mammogram for malignant neoplasm of breast: Secondary | ICD-10-CM

## 2021-02-21 LAB — LIPID PANEL
Cholesterol: 121 mg/dL (ref 0–200)
HDL: 48.3 mg/dL (ref >39.00)
LDL Cholesterol: 56 mg/dL (ref 0–99)
NonHDL: 72.81
Total CHOL/HDL Ratio: 3
Triglycerides: 85 mg/dL (ref 0.0–149.0)
VLDL: 17 mg/dL (ref 0.0–40.0)

## 2021-02-21 LAB — CBC WITH DIFFERENTIAL/PLATELET
Basophils Absolute: 0 10*3/uL (ref 0.0–0.1)
Basophils Relative: 0.7 % (ref 0.0–3.0)
Eosinophils Absolute: 0.1 10*3/uL (ref 0.0–0.7)
Eosinophils Relative: 2.4 % (ref 0.0–5.0)
HCT: 39.8 % (ref 36.0–46.0)
Hemoglobin: 13.3 g/dL (ref 12.0–15.0)
Lymphocytes Relative: 19.8 % (ref 12.0–46.0)
Lymphs Abs: 1.2 10*3/uL (ref 0.7–4.0)
MCHC: 33.4 g/dL (ref 30.0–36.0)
MCV: 95.9 fl (ref 78.0–100.0)
Monocytes Absolute: 0.6 10*3/uL (ref 0.1–1.0)
Monocytes Relative: 9.2 % (ref 3.0–12.0)
Neutro Abs: 4.2 10*3/uL (ref 1.4–7.7)
Neutrophils Relative %: 67.9 % (ref 43.0–77.0)
Platelets: 240 10*3/uL (ref 150.0–400.0)
RBC: 4.15 Mil/uL (ref 3.87–5.11)
RDW: 13.3 % (ref 11.5–15.5)
WBC: 6.1 10*3/uL (ref 4.0–10.5)

## 2021-02-21 LAB — COMPREHENSIVE METABOLIC PANEL
ALT: 11 U/L (ref 0–35)
AST: 17 U/L (ref 0–37)
Albumin: 4 g/dL (ref 3.5–5.2)
Alkaline Phosphatase: 82 U/L (ref 39–117)
BUN: 23 mg/dL (ref 6–23)
CO2: 30 mEq/L (ref 19–32)
Calcium: 9.8 mg/dL (ref 8.4–10.5)
Chloride: 101 mEq/L (ref 96–112)
Creatinine, Ser: 1.09 mg/dL (ref 0.40–1.20)
GFR: 48.62 mL/min — ABNORMAL LOW (ref >60.00)
Glucose, Bld: 113 mg/dL — ABNORMAL HIGH (ref 70–99)
Potassium: 4 mEq/L (ref 3.5–5.1)
Sodium: 136 mEq/L (ref 135–145)
Total Bilirubin: 0.5 mg/dL (ref 0.2–1.2)
Total Protein: 7.6 g/dL (ref 6.0–8.3)

## 2021-02-21 LAB — HEMOGLOBIN A1C: Hgb A1c MFr Bld: 6.2 % (ref 4.6–6.5)

## 2021-02-21 LAB — TSH: TSH: 1.28 u[IU]/mL (ref 0.35–5.50)

## 2021-02-21 MED ORDER — LEVOTHYROXINE SODIUM 88 MCG PO TABS: 88.0000 ug | ORAL_TABLET | Freq: Every day | ORAL | 3 refills | Status: DC

## 2021-02-21 MED ORDER — LOSARTAN POTASSIUM 50 MG PO TABS: 50.0000 mg | ORAL_TABLET | Freq: Two times a day (BID) | ORAL | 3 refills | Status: DC

## 2021-02-21 MED ORDER — ROSUVASTATIN CALCIUM 20 MG PO TABS: 20.0000 mg | ORAL_TABLET | Freq: Every day | ORAL | 3 refills | Status: DC

## 2021-02-21 MED ORDER — CARVEDILOL 12.5 MG PO TABS: 12.5000 mg | ORAL_TABLET | Freq: Two times a day (BID) | ORAL | 3 refills | Status: DC

## 2021-02-21 NOTE — Progress Notes (Signed)
Chief Complaint  Patient presents with   Follow-up   Injections    Prolia injection    F/u 1. Htn controlled on losartan 50 mg bid and coreg 12.5 mg bid 2. Hld on crestor 20 mg qhs  3. Hypothyroidism on synthyroid 88 mcg  4. Left eye infection and lens rejection x 6 months to 1 year after having moderna covid shot therefore declines further boosters f/u [facility] ongoing  5. Osteoporosis prolia shot given today    Review of Systems  Constitutional:  Negative for weight loss.  HENT:  Negative for hearing loss.   Eyes:  Negative for blurred vision.  Respiratory:  Negative for shortness of breath.   Cardiovascular:  Negative for chest pain.  Gastrointestinal:  Negative for abdominal pain and blood in stool.  Genitourinary:  Negative for dysuria.  Musculoskeletal:  Negative for falls and joint pain.  Skin:  Negative for rash.  Neurological:  Negative for headaches.  Psychiatric/Behavioral:  Negative for depression.   Past Medical History:  Diagnosis Date   Asthma    childhood   Chicken pox    Chronic back pain    Said spontaneous thoracic and lumbar spine fractures secondary to falls.   Colon polyps    Coronary artery disease involving native coronary artery without angina pectoris 07/2016   In setting of inferior STEMI, cardiac cath showed occluded RCA.  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  Injured her face. ->  Both aspirin and Plavix were stopped. 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 (no stents)    Hyperlipidemia with target LDL less than 70    On rosuvastatin   Hypothyroidism (acquired)    On Synthroid   Intracranial hemorrhage (HCC)    s/p fall years ago when on ? 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 Sabra Heck in [place]     [facility] evaccuation   RIGHT AND LEFT HEART CATH  07/2016   Inferior STEMI.  Culprit lesion mRPDA = was too small to address.  20% pLAD.  LCx-LPL normal.  Patent.  RCA ostial 30% RCA dominant.  Hazy plaque rupture and a small 1.5 mm PDA. --> LV Gram EF 50%, Inf HK. Increased LVEDP   TONSILLECTOMY     TRANSTHORACIC ECHOCARDIOGRAM  11/2015   Right is being within normal limits.   Family History  Problem Relation Age of Onset   Other Mother        Old age   Hearing loss Mother    Cancer Father        colon died in 79   CVA Father  Lupus Sister    Lung cancer Brother    Cancer Brother        lung smoker   Heart disease Maternal Grandmother    Dementia Sister    Colon cancer Brother    Cancer Brother        colon   Cancer Son        testicular cancer dx age 77 now 38 as of 12/2018    Cancer Sister        skin   Breast cancer Neg Hx    Social History   Socioeconomic History   Marital status: Single    Spouse name: Not on file   Number of children: 2   Years of education: Not on file   Highest education level: High school graduate  Occupational History   Occupation: Nanny  Tobacco Use   Smoking status: Never   Smokeless tobacco: Never  Vaping Use   Vaping Use: Never used  Substance and Sexual Activity   Alcohol use: Never   Drug use: Never   Sexual activity: Not Currently  Other Topics Concern   Not on file  Social History Narrative   Recently moved from [place], [place].->  Now  lives in [place], [place]   Lives alone, but very close to her son Fara Olden and his wife.   Her daughter lives in [place], [place]   She has 3 grandchildren.   widow      She drinks coffee.     She does water aerobics/pool exercises.      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  Prior to moving to [place], she was the primary caregiver for her brother who passed away in 06/07/2017.  Once he died, her children convinced her to move up to [place] to be near them.      6 other siblings 2 1/2 siblings but those 1/2 siblings died in young age   Social Determinants of Health   Financial Resource Strain: Low Risk    Difficulty of Paying Living Expenses: Not hard at all  Food Insecurity: No Food Insecurity   Worried About Charity fundraiser in the Last Year: Never true   Arboriculturist in the Last Year: Never true  Transportation Needs: No Transportation Needs   Lack of Transportation (Medical): No   Lack of Transportation (Non-Medical): No  Physical Activity: Not on file  Stress: No Stress Concern Present   Feeling of Stress : Not at all  Social Connections: Unknown   Frequency of Communication with Friends and Family: More than three times a week   Frequency of Social Gatherings with Friends and Family: More than three times a week   Attends Religious Services: Not on file   Active Member of Clubs or Organizations: Not on file   Attends Archivist Meetings: Not on file   Marital Status: Widowed  Human resources officer Violence: Not At Risk   Fear of Current or Ex-Partner: No   Emotionally Abused: No   Physically Abused: No   Sexually Abused: No   Current Meds  Medication Sig   aspirin 81 MG chewable tablet Chew by mouth daily.   Cenegermin-bkbj (OXERVATE) 0.002 % SOLN Apply to eye. Place 1 drop into the left eye 6 (six) times daily for 60 days 6 times daily in 2 hour intervals.   moxifloxacin (VIGAMOX) 0.5 % ophthalmic solution Place 1 drop into the left eye 3 (three) times daily.   prednisoLONE  acetate (  PRED FORTE) 1 % ophthalmic suspension Place 1 drop into the left eye in the morning, at noon, in the evening, and at bedtime.   [DISCONTINUED] carvedilol (COREG) 12.5 MG tablet Take 1 tablet (12.5 mg total) by mouth 2 (two) times daily with a meal.   [DISCONTINUED] losartan (COZAAR) 50 MG tablet Take 1 tablet (50 mg total) by mouth 2 (two) times daily.   [DISCONTINUED] rosuvastatin (CRESTOR) 20 MG tablet Take 1 tablet (20 mg total) by mouth daily.   [DISCONTINUED] SYNTHROID 88 MCG tablet TAKE 1 TABLET DAILY BEFORE BREAKFAST   Current Facility-Administered Medications for the 02/21/21 encounter (Office Visit) with McLean-Scocuzza, [name], MD  Medication   denosumab (PROLIA) injection 60 mg   Allergies  Allergen Reactions   Chocolate     H/a    Lactose Intolerance (Gi)    No results found for this or any previous visit (from the past 2160 hour(s)). Objective  Body mass index is 28.72 kg/m. Wt Readings from Last 3 Encounters:  02/21/21 142 lb 3.2 oz (64.5 kg)  08/29/20 145 lb 3.2 oz (65.9 kg)  07/01/20 147 lb (66.7 kg)   Temp Readings from Last 3 Encounters:  02/21/21 (!) 97.5 F (36.4 C) (Temporal)  08/29/20 (!) 97.2 F (36.2 C) (Temporal)  06/16/20 97.6 F (36.4 C)   BP Readings from Last 3 Encounters:  02/21/21 124/80  08/29/20 130/72  07/01/20 114/68   Pulse Readings from Last 3 Encounters:  02/21/21 77  08/29/20 65  07/01/20 64    Physical Exam Vitals and nursing note reviewed.  Constitutional:      Appearance: Normal appearance. She is well-developed and well-groomed.  HENT:     Head: Normocephalic and atraumatic.  Eyes:     Conjunctiva/sclera: Conjunctivae normal.     Pupils: Pupils are equal, round, and reactive to light.  Cardiovascular:     Rate and Rhythm: Normal rate and regular rhythm.     Heart sounds: Normal heart sounds. No murmur heard. Pulmonary:     Effort: Pulmonary effort is normal.     Breath sounds: Normal breath sounds.   Abdominal:     General: Abdomen is flat. Bowel sounds are normal.     Tenderness: There is no abdominal tenderness.  Musculoskeletal:        General: No tenderness.  Skin:    General: Skin is warm and dry.  Neurological:     General: No focal deficit present.     Mental Status: She is alert and oriented to person, place, and time. Mental status is at baseline.     Cranial Nerves: Cranial nerves 2-12 are intact.     Gait: Gait is intact.  Psychiatric:        Attention and Perception: Attention and perception normal.        Mood and Affect: Mood and affect normal.        Speech: Speech normal.        Behavior: Behavior normal. Behavior is cooperative.        Thought Content: Thought content normal.        Cognition and Memory: Cognition and memory normal.        Judgment: Judgment normal.    Assessment  Plan  Essential hypertension controlled coreg 12.5 mg bid and losartan 50 mg bid- Plan: Comprehensive metabolic panel, Lipid panel, CBC with Differential/Platelet  Hyperlipidemia, unspecified hyperlipidemia type - Plan: Lipid panel Crestor 20 mg qhs   Hypothyroidism, unspecified type - Plan: TSH Levo 88  mcg   Prediabetes - Plan: Hemoglobin A1c   HM Declines flu shot and other vaccines prev  J&J x 1, moderna 03/01/20 declines ? Related to left eye lens rejection/infection started 3 days after moderna booster  pna shot per pt had in 2016 ? Which one  Had zostervax at [facility] in [place] declines shingrix  Declines vaccines I.e covid  vx prev declined Tdap, prevnar    Mammogram 03/11/19 neg shc 06/29/20 negative   Colonoscopy with h/o polyps in 2015 last age 34 did not do and may like to do stool kit in future  12/10/19 bx colonoscopy negative    S/p hysterectomy with BSO 1989 DUB endometrosis out of age window    DEXA 03/11/19 osteopenia on prolia had inj 06/16/20+   -DEXA 09/2015 T score -2.5 +compression fractures T12 and L1-4  ? Had 06/12/16 cant read copy h/o osteopenia/porosis  with comp. fxs need to get copy of dexa was on prolia last shot 04/2018   -prolia injection had 05/26/2018, 10/10/17, 04/01/17, 09/24/16   -she was also on forteo 08/07/16    Consider derm in future referral placed seen in 2022 normal exam   Former PCP Dr. Lubertha Basque in [place] [phone] (get copy of labs, vaccines, mammogram, colonoscopy, DEXA, imaging) release faxed  -2nd request 01/30/2019    Reviewed records Dr. August Saucer I.e labs and Dr. Jimmye Norman  Compression fx T12, L1-4      Cards Dr. Glenetta Hew in [place]  Dr. Brigitte Pulse eye in [place]  Endocrine in [facility] [phone] Eye surgery in [facility] Dr. Sabra Heck >[facility] Dr. Adonis Housekeeper seen 06/08/20 f/u in 6 weeks  [facility]    Provider: Dr. [name] McLean-Scocuzza-Internal Medicine

## 2021-02-21 NOTE — Progress Notes (Signed)
Patient presenting for Prolia injection. This is not the Patient's first injection. Patient states no allergy or previous reaction to other Prolia injections.   Given to the Patient in the back of the upper left arm. Patient tolerated injection well.

## 2021-04-14 ENCOUNTER — Ambulatory Visit (INDEPENDENT_AMBULATORY_CARE_PROVIDER_SITE_OTHER): Payer: Medicare Other

## 2021-04-14 VITALS — Ht 59.0 in | Wt 142.0 lb

## 2021-04-14 DIAGNOSIS — Z0000 Encounter for general adult medical examination without abnormal findings: Secondary | ICD-10-CM

## 2021-04-14 NOTE — Progress Notes (Signed)
Subjective:   Amy Montes is a 81 y.o. female who presents for Medicare Annual (Subsequent) preventive examination.  Review of Systems    No ROS.  Medicare Wellness Virtual Visit.  Visual/audio telehealth visit, UTA vital signs.   See social history for additional risk factors.   Cardiac Risk Factors include: advanced age (>4men, >25 women)     Objective:    Today's Vitals   04/14/21 1447  Weight: 142 lb (64.4 kg)  Height: 4\' 11"  (1.499 m)   Body mass index is 28.68 kg/m.  Advanced Directives 04/13/2020 12/10/2019 04/08/2019 09/19/2017  Does Patient Have a Medical Advance Directive? Yes Yes Yes Yes  Type of Paramedic of [place];Living will [place];Living will [place];Living will Living will  Does patient want to make changes to medical advance directive? No - Patient declined - - -  Copy of [place] in Chart? No - copy requested No - copy requested No - copy requested -   Current Medications (verified) Outpatient Encounter Medications as of 04/14/2021  Medication Sig   aspirin 81 MG chewable tablet Chew by mouth daily.   carvedilol (COREG) 12.5 MG tablet Take 1 tablet (12.5 mg total) by mouth 2 (two) times daily with a meal.   Cenegermin-bkbj (OXERVATE) 0.002 % SOLN Apply to eye. Place 1 drop into the left eye 6 (six) times daily for 60 days 6 times daily in 2 hour intervals.   levothyroxine (SYNTHROID) 88 MCG tablet Take 1 tablet (88 mcg total) by mouth daily before breakfast.   losartan (COZAAR) 50 MG tablet Take 1 tablet (50 mg total) by mouth 2 (two) times daily.   moxifloxacin (VIGAMOX) 0.5 % ophthalmic solution Place 1 drop into the left eye 3 (three) times daily.   rosuvastatin (CRESTOR) 20 MG tablet Take 1 tablet (20 mg total) by mouth daily.   Facility-Administered Encounter Medications as of 04/14/2021  Medication   denosumab (PROLIA) injection 60 mg   Allergies  (verified) Chocolate and Lactose intolerance (gi)   History: Past Medical History:  Diagnosis Date   Asthma    childhood   Chicken pox    Chronic back pain    Said spontaneous thoracic and lumbar spine fractures secondary to falls.   Colon polyps    Coronary artery disease involving native coronary artery without angina pectoris 07/2016   In setting of inferior STEMI, cardiac cath showed occluded RCA.  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  Injured her face. ->  Both aspirin and Plavix were stopped. 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 (no stents)    Hyperlipidemia with target LDL less than 70    On rosuvastatin   Hypothyroidism (acquired)    On Synthroid   Intracranial hemorrhage (HCC)    s/p fall years ago when on ? plavix so this was stopped    Multiple fracture    x 5 vertebrae    Urinary incontinence    UTI (urinary tract infection)    Past Surgical History:  Procedure Laterality Date   ABDOMINAL HYSTERECTOMY     2/2 endometriosis and DUB   BLADDER REPAIR     cataract surgery     b/l  CESAREAN SECTION     COLONOSCOPY WITH PROPOFOL N/A 12/10/2019   Procedure: COLONOSCOPY WITH PROPOFOL;  Surgeon: Jonathon Bellows, MD;  Location: [facility] ENDOSCOPY;  Service: Gastroenterology;  Laterality: N/A;   CORNEAL TRANSPLANT Left    Apparently was a failed attempt.07/02/2016 Dr. Sabra Heck in [place]     [facility] evaccuation   RIGHT AND LEFT HEART CATH  07/2016   Inferior STEMI.  Culprit lesion mRPDA = was too small to address.  20% pLAD.  LCx-LPL normal.  Patent.  RCA ostial 30% RCA dominant.  Hazy plaque rupture and a small 1.5 mm PDA. --> LV Gram EF 50%, Inf HK. Increased LVEDP   TONSILLECTOMY     TRANSTHORACIC ECHOCARDIOGRAM  11/2015   Right is being within normal limits.   Family  History  Problem Relation Age of Onset   Other Mother        Old age   Hearing loss Mother    Cancer Father        colon died in 13   CVA Father    Lupus Sister    Lung cancer Brother    Cancer Brother        lung smoker   Heart disease Maternal Grandmother    Dementia Sister    Colon cancer Brother    Cancer Brother        colon   Cancer Son        testicular cancer dx age 80 now 75 as of 12/2018    Cancer Sister        skin   Breast cancer Neg Hx    Social History   Socioeconomic History   Marital status: Single    Spouse name: Not on file   Number of children: 2   Years of education: Not on file   Highest education level: High school graduate  Occupational History   Occupation: Nanny  Tobacco Use   Smoking status: Never   Smokeless tobacco: Never  Vaping Use   Vaping Use: Never used  Substance and Sexual Activity   Alcohol use: Never   Drug use: Never   Sexual activity: Not Currently  Other Topics Concern   Not on file  Social History Narrative   Recently moved from [place], [place].->  Now lives in [place], [place]   Lives alone, but very close to her son Fara Olden and his wife.   Her daughter lives in [place], [place] 1 biological    Has adopted kids x 2    She has 3 grandchildren.   widow      She drinks coffee.     She does water aerobics/pool exercises.      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  Prior to moving to [place], she was the primary caregiver for her brother who passed away in 07-02-2017.  Once he died, her children convinced her to move up to [place] to be near them.      6 other siblings 2 1/2 siblings but those 1/2 siblings died in young age   Social Determinants of Health   Financial Resource Strain: Low Risk    Difficulty of Paying Living Expenses: Not hard at all  Food Insecurity: No Food Insecurity   Worried About Charity fundraiser in the Last Year: Never true   Arboriculturist in the Last Year: Never true  Transportation Needs: No  Transportation Needs   Lack of Transportation (Medical): No   Lack of  Transportation (Non-Medical): No  Physical Activity: Insufficiently Active   Days of Exercise per Week: 4 days   Minutes of Exercise per Session: 20 min  Stress: No Stress Concern Present   Feeling of Stress : Not at all  Social Connections: Unknown   Frequency of Communication with Friends and Family: More than three times a week   Frequency of Social Gatherings with Friends and Family: More than three times a week   Attends Religious Services: Not on file   Active Member of Clubs or Organizations: Not on file   Attends Archivist Meetings: Not on file   Marital Status: Widowed   Tobacco Counseling Counseling given: Not Answered   Clinical Intake:  Pre-visit preparation completed: Yes        Diabetes: No  How often do you need to have someone help you when you read instructions, pamphlets, or other written materials from your doctor or pharmacy?: 2 - Rarely  Interpreter Needed?: No      Activities of Daily Living In your present state of health, do you have any difficulty performing the following activities: 04/14/2021  Hearing? N  Vision? N  Difficulty concentrating or making decisions? N  Walking or climbing stairs? Y  Comment Intermittent chronic back pain. Paces self when walking.  Dressing or bathing? N  Doing errands, shopping? Y  Comment [place] and eating ? N  Using the Toilet? N  In the past six months, have you accidently leaked urine? Y  Comment Managed with daily pad  Do you have problems with loss of bowel control? N  Managing your Medications? N  Managing your Finances? N  Housekeeping or managing your Housekeeping? N  Some recent data might be hidden   Patient Care Team: McLean-Scocuzza, [name], MD as PCP - General (Internal Medicine) Leonie Man, MD as PCP - Cardiology (Cardiology)  Indicate any recent Medical Services you may have  received from other than [facility] providers in the past year (date may be approximate).     Assessment:   This is a routine wellness examination for [place].  Virtual Visit via Telephone Note  I connected with  Lavena Bullion on 04/14/21 at  2:45 PM EST by telephone and verified that I am speaking with the correct person using two identifiers.  Persons participating in the virtual visit: patient/Nurse Health Advisor   I discussed the limitations, risks, security and privacy concerns of performing an evaluation and management service by telephone and the availability of in person appointments. The patient expressed understanding and agreed to proceed.  Interactive audio and video telecommunications were attempted between this nurse and patient, however failed, due to patient having technical difficulties OR patient did not have access to video capability.  We continued and completed visit with audio only.  Some vital signs may be absent or patient reported.   Hearing/Vision screen Hearing Screening - Comments:: Patient is able to hear conversational tones without difficulty. No issues reported. Vision Screening - Comments:: Followed by their ophthalmologist every 6 months. Left eye cornea transplant.  Dietary issues and exercise activities discussed: Current Exercise Habits: Home exercise routine, Type of exercise: walking, Intensity: Mild  Healthy diet Low salt Good water intake  Goals Addressed             This Visit's Progress    Follow up with Primary Care Provider       As needed       Depression Screen PHQ  2/9 Scores 04/14/2021 04/13/2020 11/06/2019 05/07/2019 04/08/2019 01/06/2019  PHQ - 2 Score 0 0 0 0 0 0    Fall Risk Fall Risk  04/14/2021 04/13/2020 11/06/2019 05/07/2019 04/08/2019  Falls in the past year? 1 0 0 0 0  Number falls in past yr: 0 0 0 - -  Injury with Fall? 0 0 0 - -  Follow up Falls evaluation completed Falls evaluation completed Falls evaluation completed -  Falls evaluation completed   FALL RISK PREVENTION PERTAINING TO THE HOME: Home free of loose throw rugs in walkways, pet beds, electrical cords, etc? Yes  Adequate lighting in your home to reduce risk of falls? Yes   ASSISTIVE DEVICES UTILIZED TO PREVENT FALLS: Life alert? No  Use of a cane, walker or w/c? No Grab bars in the bathroom? Yes  Shower chair or bench in shower? No  Elevated toilet seat or a handicapped toilet? Yes   TIMED UP AND GO: Was the test performed? No .   Cognitive Function:  Patient is alert and oriented x3.    6CIT Screen 04/14/2021 04/13/2020 04/08/2019  What Year? 0 points 0 points 0 points  What month? 0 points 0 points 0 points  What time? 0 points 0 points 0 points  Count back from 20 0 points 0 points 0 points  Months in reverse 0 points 0 points 0 points  Repeat phrase 0 points 0 points 0 points  Total Score 0 0 0   Immunizations Immunization History  Administered Date(s) Administered   Janssen (J&J) SARS-COV-2 Vaccination 08/21/2019   Moderna SARS-COV2 Booster Vaccination 03/01/2020   TDAP status: Due, Education has been provided regarding the importance of this vaccine. Advised may receive this vaccine at local pharmacy or Health Dept. Aware to provide a copy of the vaccination record if obtained from local pharmacy or Health Dept. Verbalized acceptance and understanding. Deferred.   Screening Tests Health Maintenance  Topic Date Due   Zoster Vaccines- Shingrix (1 of 2) 05/23/2021 (Originally 07/13/1959)   INFLUENZA VACCINE  06/23/2021 (Originally 10/24/2020)   Pneumonia Vaccine 90+ Years old (1 - PCV) 02/21/2022 (Originally 07/13/1946)   COVID-19 Vaccine (2 - Janssen risk series) 02/23/2022 (Originally 03/29/2020)   TETANUS/TDAP  04/14/2022 (Originally 07/13/1959)   DEXA SCAN  Completed   Hepatitis C Screening  Completed   HPV VACCINES  Aged Out   Health Maintenance There are no preventive care reminders to display for this patient.  Bone  density- ordered on 02/21/21  Lung Cancer Screening: (Low Dose CT Chest recommended if Age 48-80 years, 30 pack-year currently smoking OR have quit w/in 15years.) does not qualify.   Hepatitis C Screening: does not qualify  Vision Screening: Recommended annual ophthalmology exams for early detection of glaucoma and other disorders of the eye.  Dental Screening: Recommended annual dental exams for proper oral hygiene.  Community Resource Referral / Chronic Care Management: CRR required this visit?  No   CCM required this visit?  No      Plan:   Keep all routine maintenance appointments.   I have personally reviewed and noted the following in the patients chart:   Medical and social history Use of alcohol, tobacco or illicit drugs  Current medications and supplements including opioid prescriptions. Not taking Opioid.  Functional ability and status Nutritional status Physical activity Advanced directives List of other physicians Hospitalizations, surgeries, and ER visits in previous 12 months Vitals Screenings to include cognitive, depression, and falls Referrals and appointments  In addition, I  have reviewed and discussed with patient certain preventive protocols, quality metrics, and best practice recommendations. A written personalized care plan for preventive services as well as general preventive health recommendations were provided to patient.     Varney Biles, LPN   10/03/6577

## 2021-04-14 NOTE — Patient Instructions (Addendum)
Amy Montes , Thank you for taking time to come for your Medicare Wellness Visit. I appreciate your ongoing commitment to your health goals. Please review the following plan we discussed and let me know if I can assist you in the future.   These are the goals we discussed:  Goals      Follow up with Primary Care Provider     As needed        This is a list of the screening recommended for you and due dates:  Health Maintenance  Topic Date Due   Zoster (Shingles) Vaccine (1 of 2) 05/23/2021*   Flu Shot  06/23/2021*   Pneumonia Vaccine (1 - PCV) 02/21/2022*   COVID-19 Vaccine (2 - Janssen risk series) 02/23/2022*   Tetanus Vaccine  04/14/2022*   DEXA scan (bone density measurement)  Completed   Hepatitis C Screening: USPSTF Recommendation to screen - Ages 58-79 yo.  Completed   HPV Vaccine  Aged Out  *Topic was postponed. The date shown is not the original due date.      Advanced directives: End of life planning; Advance aging; Advanced directives discussed.  Copy of current HCPOA/Living Will requested.    Conditions/risks identified: none new  Follow up in one year for your annual wellness visit    Preventive Care 65 Years and Older, Female Preventive care refers to lifestyle choices and visits with your health care provider that can promote health and wellness. What does preventive care include? A yearly physical exam. This is also called an annual well check. Dental exams once or twice a year. Routine eye exams. Ask your health care provider how often you should have your eyes checked. Personal lifestyle choices, including: Daily care of your teeth and gums. Regular physical activity. Eating a healthy diet. Avoiding tobacco and drug use. Limiting alcohol use. Practicing safe sex. Taking low-dose aspirin every day. Taking vitamin and mineral supplements as recommended by your health care provider. What happens during an annual well check? The services and screenings  done by your health care provider during your annual well check will depend on your age, overall health, lifestyle risk factors, and family history of disease. Counseling  Your health care provider may ask you questions about your: Alcohol use. Tobacco use. Drug use. Emotional well-being. Home and relationship well-being. Sexual activity. Eating habits. History of falls. Memory and ability to understand (cognition). Work and work Statistician. Reproductive health. Screening  You may have the following tests or measurements: Height, weight, and BMI. Blood pressure. Lipid and cholesterol levels. These may be checked every 5 years, or more frequently if you are over 66 years old. Skin check. Lung cancer screening. You may have this screening every year starting at age 48 if you have a 30-pack-year history of smoking and currently smoke or have quit within the past 15 years. Fecal occult blood test (FOBT) of the stool. You may have this test every year starting at age 86. Flexible sigmoidoscopy or colonoscopy. You may have a sigmoidoscopy every 5 years or a colonoscopy every 10 years starting at age 65. Hepatitis C blood test. Hepatitis B blood test. Sexually transmitted disease (STD) testing. Diabetes screening. This is done by checking your blood sugar (glucose) after you have not eaten for a while (fasting). You may have this done every 1-3 years. Bone density scan. This is done to screen for osteoporosis. You may have this done starting at age 82. Mammogram. This may be done every 1-2 years. Talk to  your health care provider about how often you should have regular mammograms. Talk with your health care provider about your test results, treatment options, and if necessary, the need for more tests. Vaccines  Your health care provider may recommend certain vaccines, such as: Influenza vaccine. This is recommended every year. Tetanus, diphtheria, and acellular pertussis (Tdap, Td)  vaccine. You may need a Td booster every 10 years. Zoster vaccine. You may need this after age 74. Pneumococcal 13-valent conjugate (PCV13) vaccine. One dose is recommended after age 68. Pneumococcal polysaccharide (PPSV23) vaccine. One dose is recommended after age 50. Talk to your health care provider about which screenings and vaccines you need and how often you need them. This information is not intended to replace advice given to you by your health care provider. Make sure you discuss any questions you have with your health care provider. Document Released: 04/08/2015 Document Revised: 11/30/2015 Document Reviewed: 01/11/2015 Elsevier Interactive Patient Education  2017 [place] Prevention in the Home Falls can cause injuries. They can happen to people of all ages. There are many things you can do to make your home safe and to help prevent falls. What can I do on the outside of my home? Regularly fix the edges of walkways and driveways and fix any cracks. Remove anything that might make you trip as you walk through a door, such as a raised step or threshold. Trim any bushes or trees on the path to your home. Use bright outdoor lighting. Clear any walking paths of anything that might make someone trip, such as rocks or tools. Regularly check to see if handrails are loose or broken. Make sure that both sides of any steps have handrails. Any raised decks and porches should have guardrails on the edges. Have any leaves, snow, or ice cleared regularly. Use sand or salt on walking paths during winter. Clean up any spills in your garage right away. This includes oil or grease spills. What can I do in the bathroom? Use night lights. Install grab bars by the toilet and in the tub and shower. Do not use towel bars as grab bars. Use non-skid mats or decals in the tub or shower. If you need to sit down in the shower, use a plastic, non-slip stool. Keep the floor dry. Clean up any  water that spills on the floor as soon as it happens. Remove soap buildup in the tub or shower regularly. Attach bath mats securely with double-sided non-slip rug tape. Do not have throw rugs and other things on the floor that can make you trip. What can I do in the bedroom? Use night lights. Make sure that you have a light by your bed that is easy to reach. Do not use any sheets or blankets that are too big for your bed. They should not hang down onto the floor. Have a firm chair that has side arms. You can use this for support while you get dressed. Do not have throw rugs and other things on the floor that can make you trip. What can I do in the kitchen? Clean up any spills right away. Avoid walking on wet floors. Keep items that you use a lot in easy-to-reach places. If you need to reach something above you, use a strong step stool that has a grab bar. Keep electrical cords out of the way. Do not use floor polish or wax that makes floors slippery. If you must use wax, use non-skid floor wax. Do not  have throw rugs and other things on the floor that can make you trip. What can I do with my stairs? Do not leave any items on the stairs. Make sure that there are handrails on both sides of the stairs and use them. Fix handrails that are broken or loose. Make sure that handrails are as long as the stairways. Check any carpeting to make sure that it is firmly attached to the stairs. Fix any carpet that is loose or worn. Avoid having throw rugs at the top or bottom of the stairs. If you do have throw rugs, attach them to the floor with carpet tape. Make sure that you have a light switch at the top of the stairs and the bottom of the stairs. If you do not have them, ask someone to add them for you. What else can I do to help prevent falls? Wear shoes that: Do not have high heels. Have rubber bottoms. Are comfortable and fit you well. Are closed at the toe. Do not wear sandals. If you use a  stepladder: Make sure that it is fully opened. Do not climb a closed stepladder. Make sure that both sides of the stepladder are locked into place. Ask someone to hold it for you, if possible. Clearly mark and make sure that you can see: Any grab bars or handrails. First and last steps. Where the edge of each step is. Use tools that help you move around (mobility aids) if they are needed. These include: Canes. Walkers. Scooters. Crutches. Turn on the lights when you go into a dark area. Replace any light bulbs as soon as they burn out. Set up your furniture so you have a clear path. Avoid moving your furniture around. If any of your floors are uneven, fix them. If there are any pets around you, be aware of where they are. Review your medicines with your doctor. Some medicines can make you feel dizzy. This can increase your chance of falling. Ask your doctor what other things that you can do to help prevent falls. This information is not intended to replace advice given to you by your health care provider. Make sure you discuss any questions you have with your health care provider. Document Released: 01/06/2009 Document Revised: 08/18/2015 Document Reviewed: 04/16/2014 Elsevier Interactive Patient Education  2017 [employer].

## 2021-07-06 IMAGING — DX DG CHEST 2V
2 series · 2 of 2 positions shown · non-contrast
Comparison: None.

CLINICAL DATA: Dyspnea

EXAM:
CHEST - 2 VIEW

[chest pa]
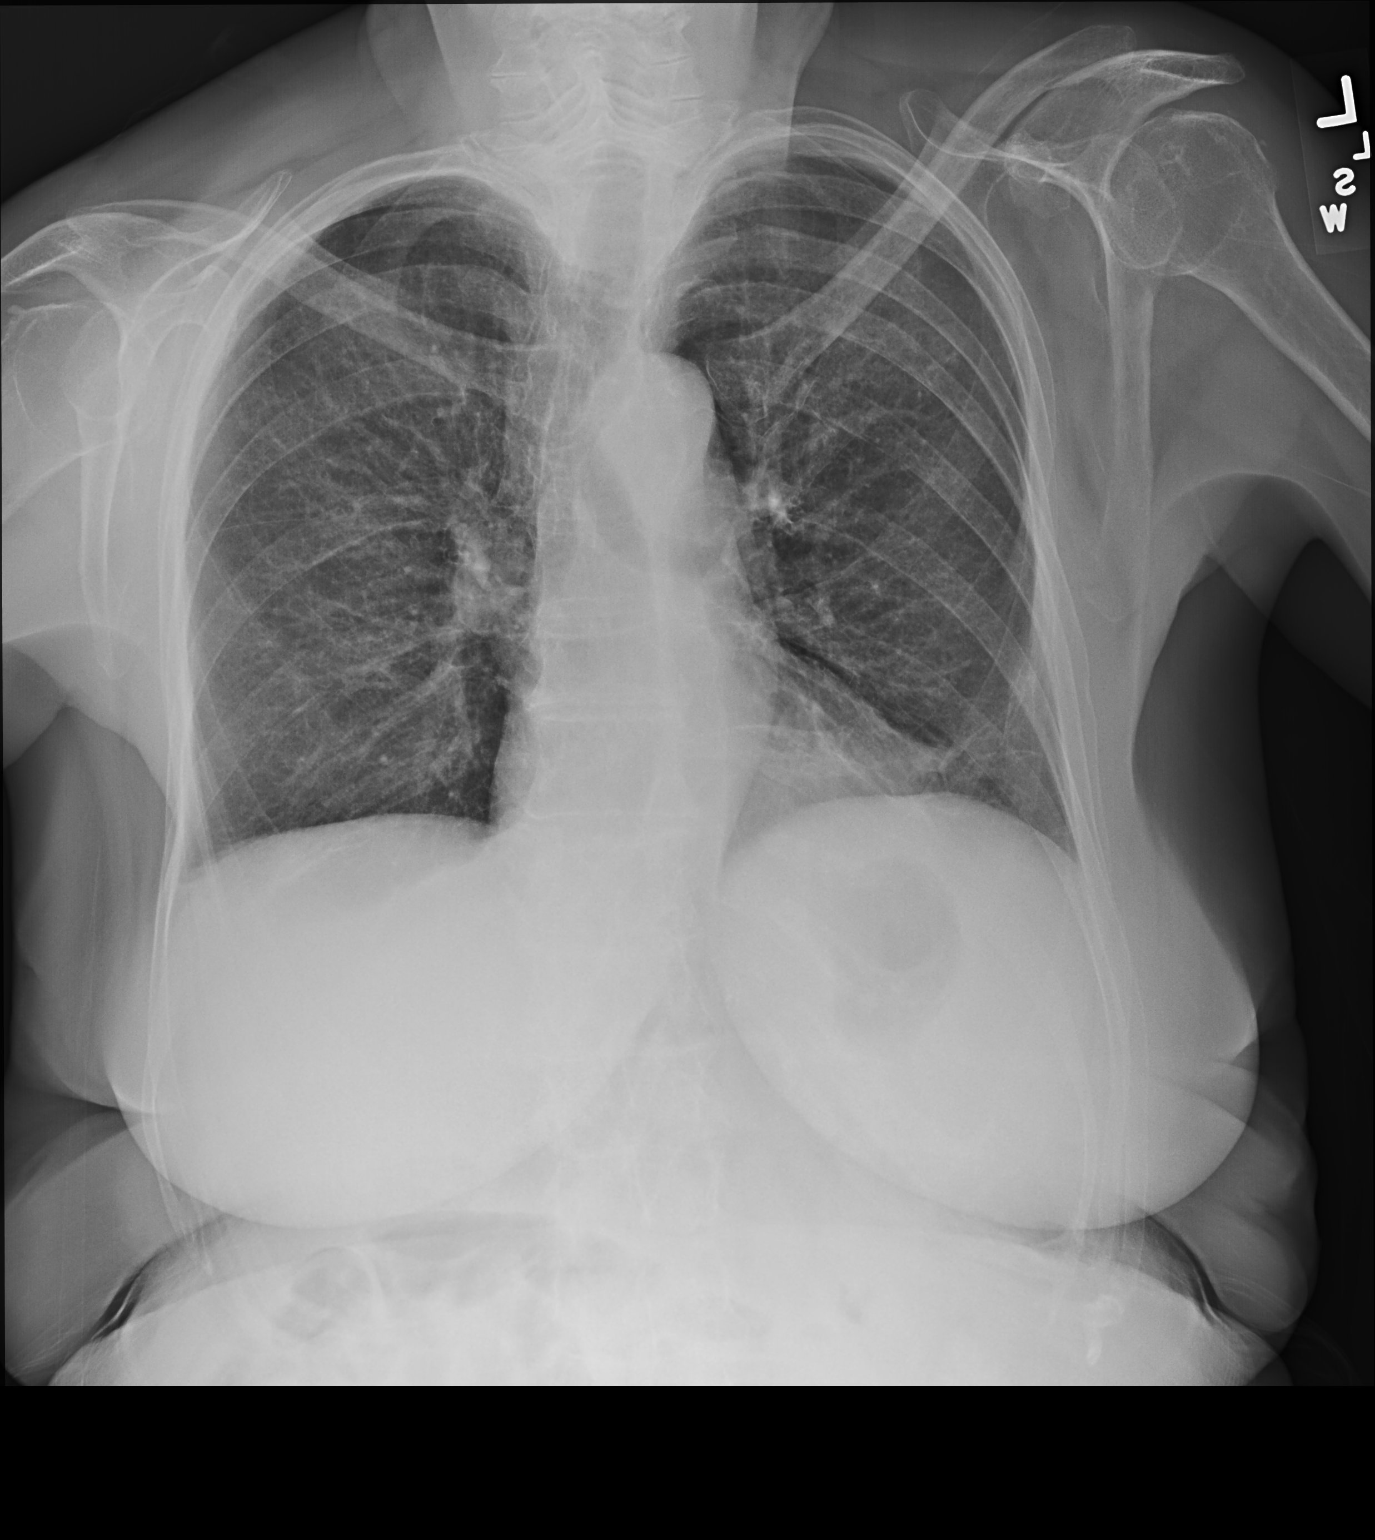

[chest lat]
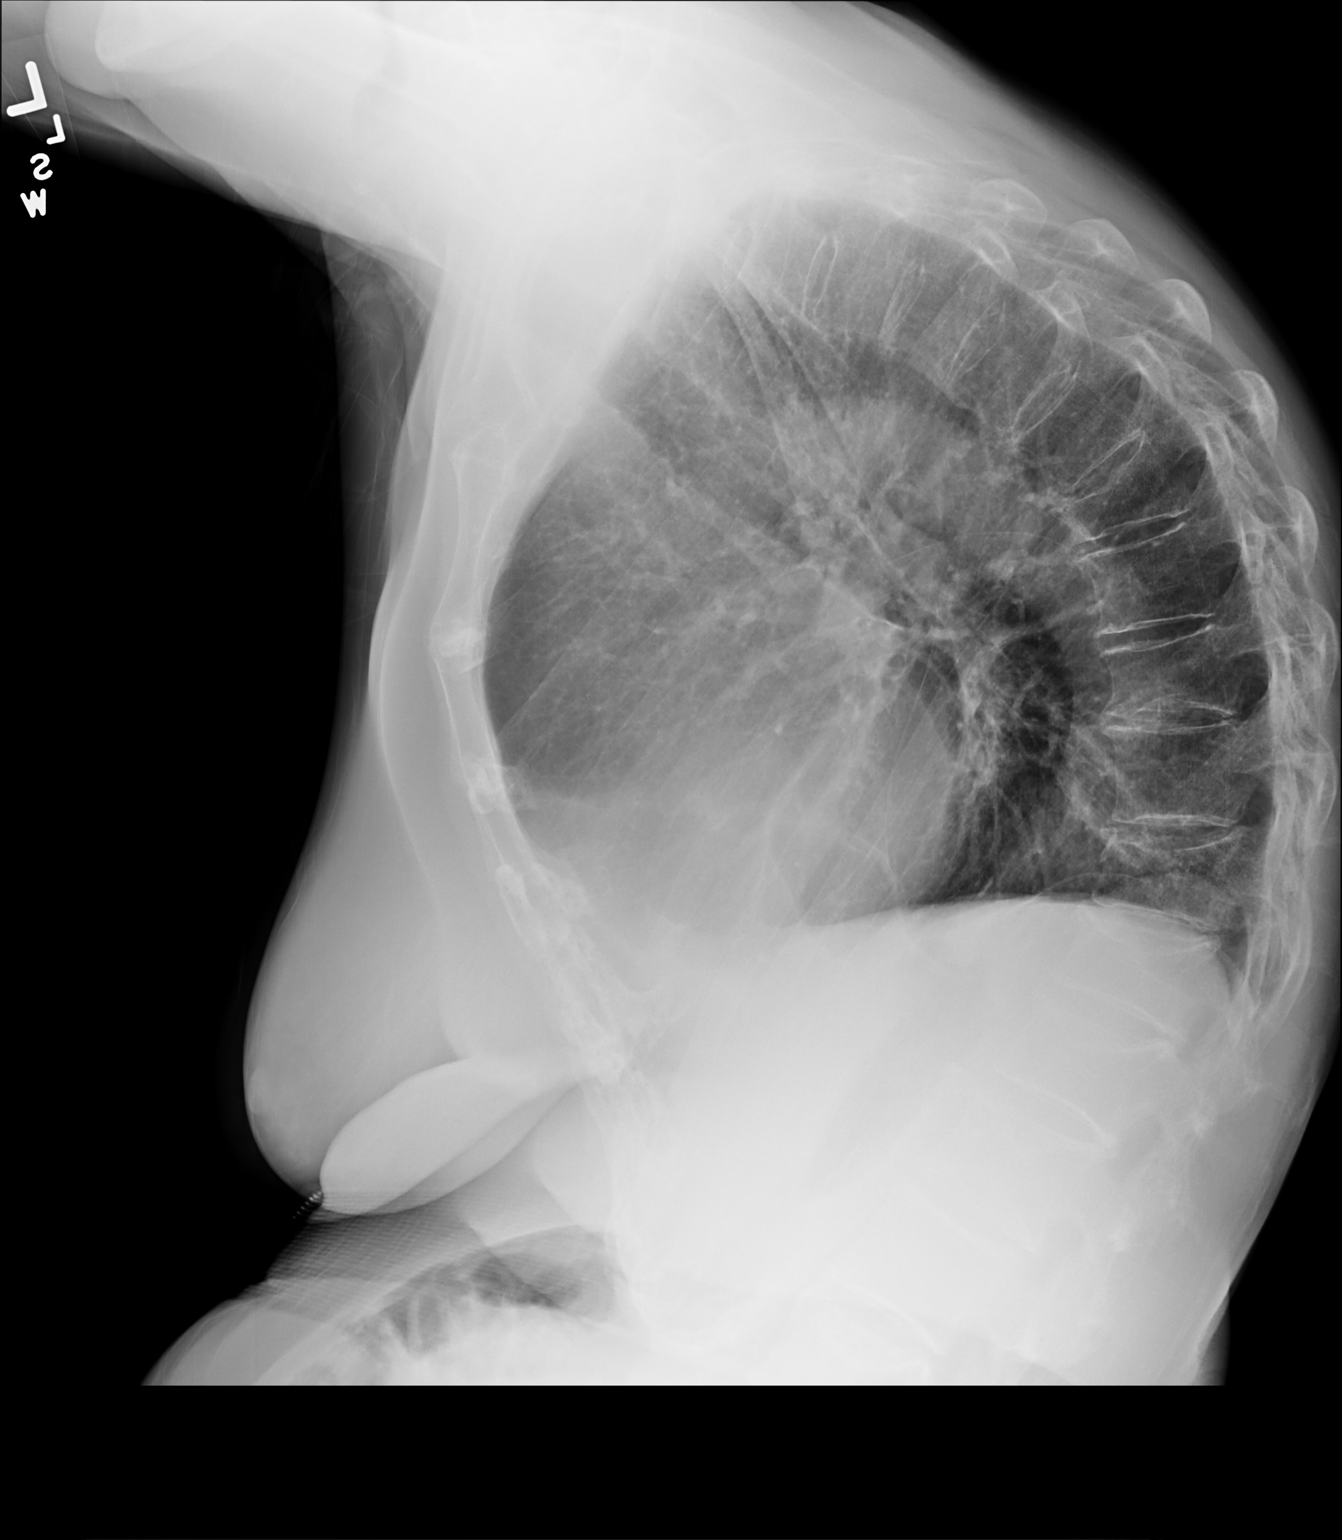

[2 of 2 positions shown; findings below may reference images not displayed]

FINDINGS: Normal heart size. Normal mediastinal contour. No pneumothorax. No
pleural effusion. Hyperinflated lungs. Minimal curvilinear scarring
versus atelectasis at the left lung base. No pulmonary edema. No
acute consolidative airspace disease.
IMPRESSION: 1. Minimal curvilinear scarring versus atelectasis at the left lung
base.
2. Hyperinflated lungs, cannot exclude obstructive lung disease.

## 2021-07-11 DIAGNOSIS — M545 Low back pain, unspecified: Secondary | ICD-10-CM | POA: Insufficient documentation

## 2021-07-11 DIAGNOSIS — Z8679 Personal history of other diseases of the circulatory system: Secondary | ICD-10-CM | POA: Insufficient documentation

## 2021-07-11 DIAGNOSIS — Z8781 Personal history of (healed) traumatic fracture: Secondary | ICD-10-CM | POA: Insufficient documentation

## 2021-07-11 DIAGNOSIS — I2119 ST elevation (STEMI) myocardial infarction involving other coronary artery of inferior wall: Secondary | ICD-10-CM | POA: Insufficient documentation

## 2021-07-11 DIAGNOSIS — Z01818 Encounter for other preprocedural examination: Secondary | ICD-10-CM | POA: Insufficient documentation

## 2021-07-11 HISTORY — DX: Personal history of (healed) traumatic fracture: Z87.81

## 2021-07-11 HISTORY — DX: Encounter for other preprocedural examination: Z01.818

## 2021-07-11 HISTORY — DX: ST elevation (STEMI) myocardial infarction involving other coronary artery of inferior wall: I21.19

## 2021-07-11 HISTORY — DX: Personal history of other diseases of the circulatory system: Z86.79

## 2021-07-19 IMAGING — MG MM DIGITAL SCREENING BILAT W/ TOMO AND CAD
8 series · 8 of 24 positions shown · non-contrast
Comparison: Previous exam(s).

CLINICAL DATA: Screening.

EXAM:
DIGITAL SCREENING BILATERAL MAMMOGRAM WITH TOMOSYNTHESIS AND CAD
TECHNIQUE: Bilateral screening digital craniocaudal and mediolateral oblique
mammograms were obtained. Bilateral screening digital breast
tomosynthesis was performed. The images were evaluated with
computer-aided detection.

[R CC synth-2D]
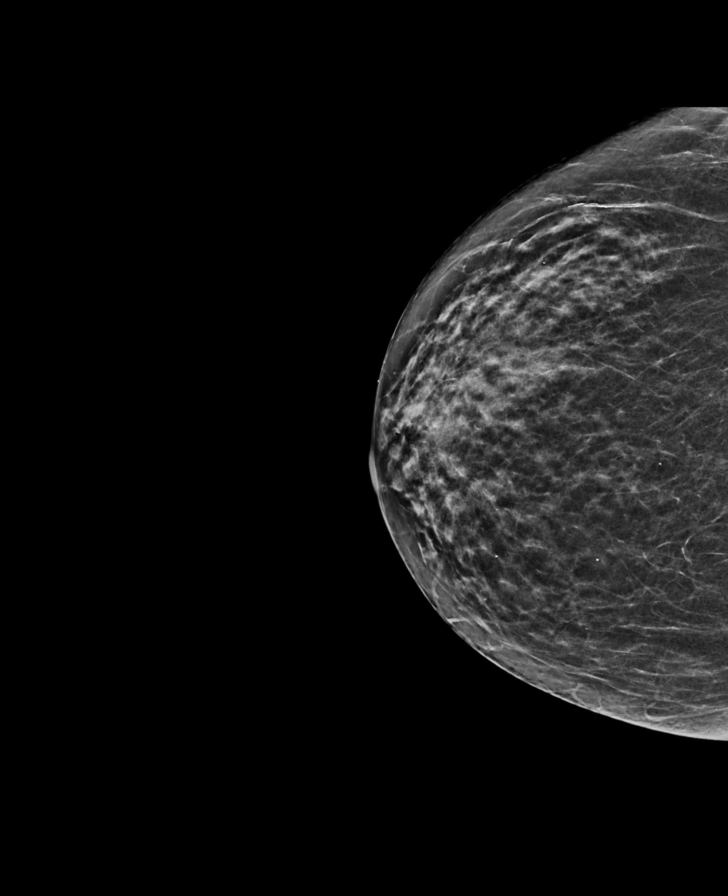

[R MLO synth-2D]
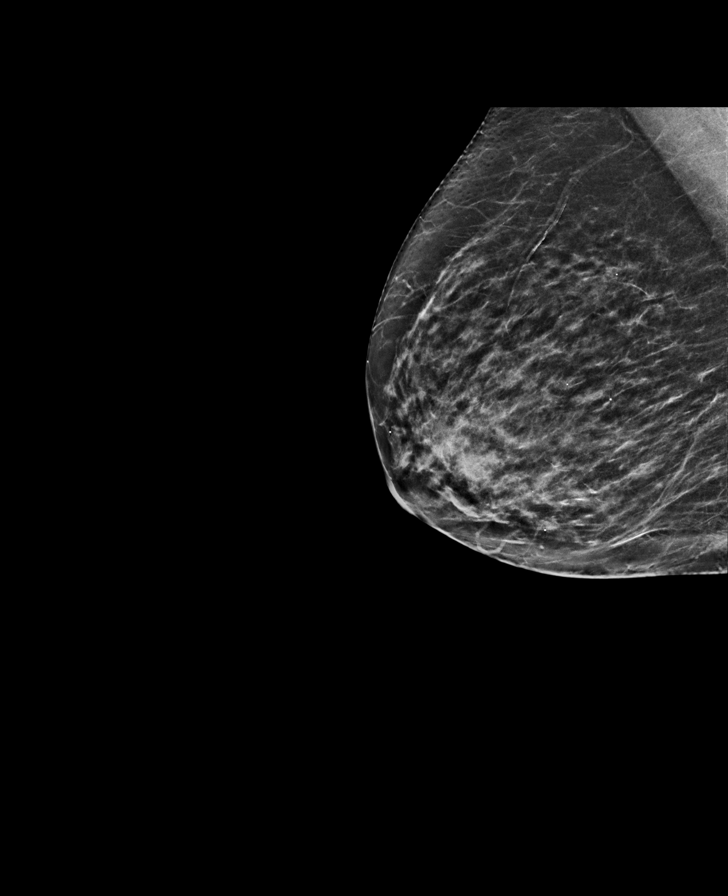

[L CC synth-2D]
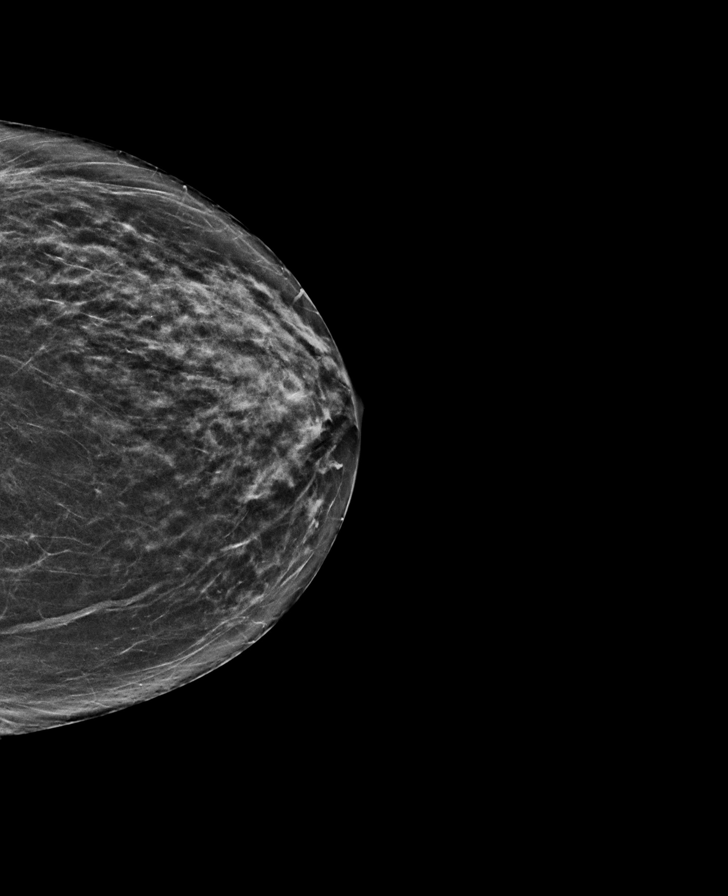

[L MLO synth-2D]
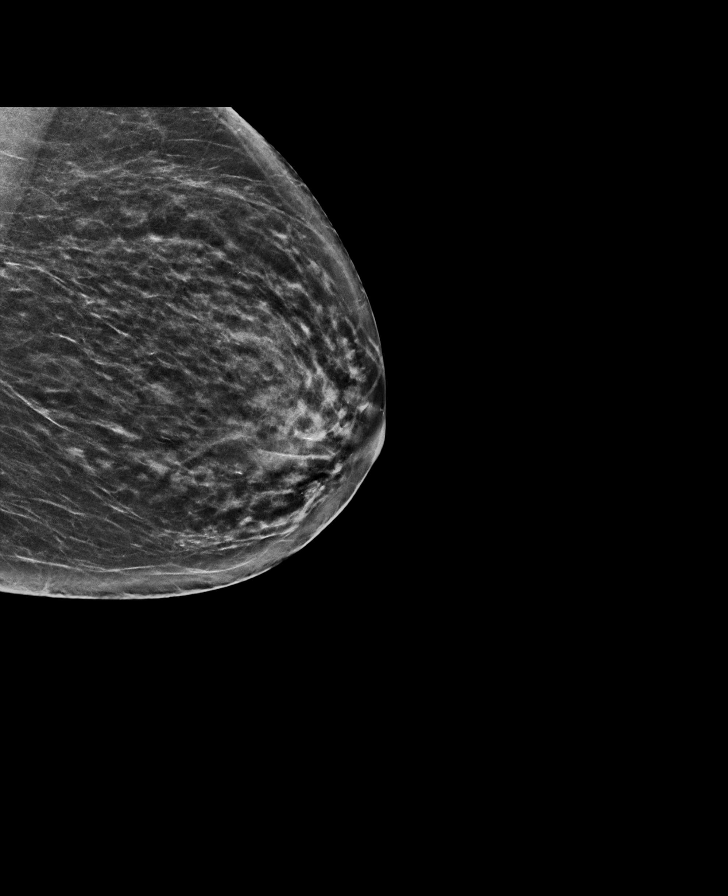

[L CC tomo · tomo slice 27/54.0]
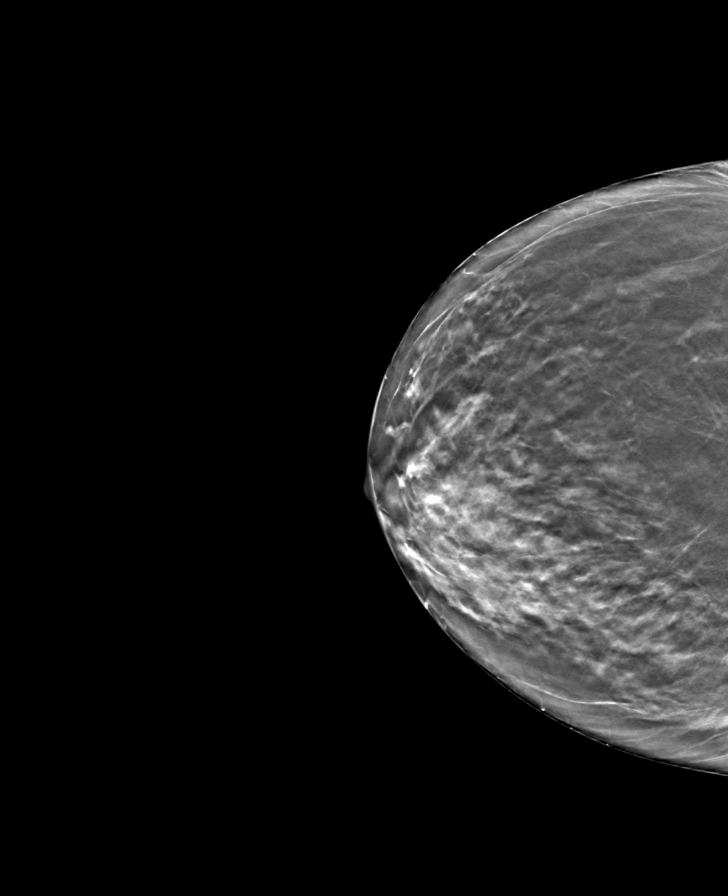

[L MLO tomo · tomo slice 31/61.0]
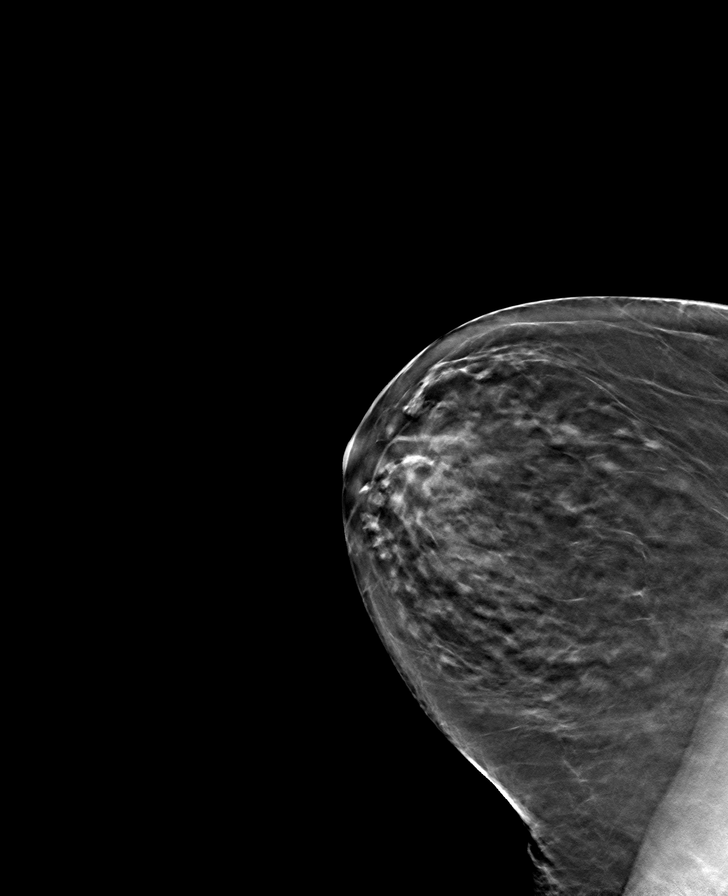

[R MLO tomo · tomo slice 31/62.0]
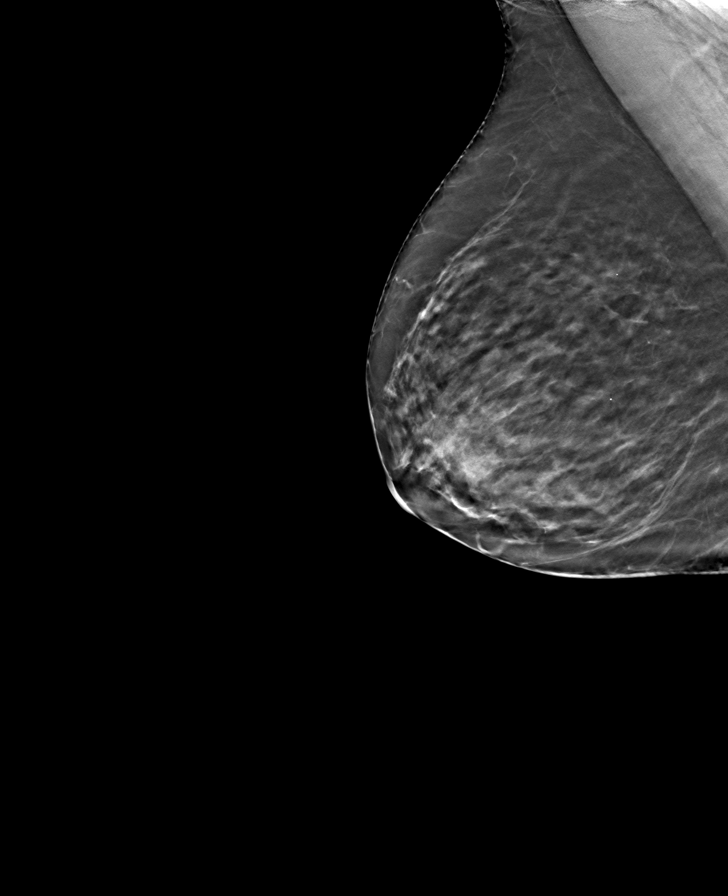

[R CC tomo · tomo slice 27/53.0]
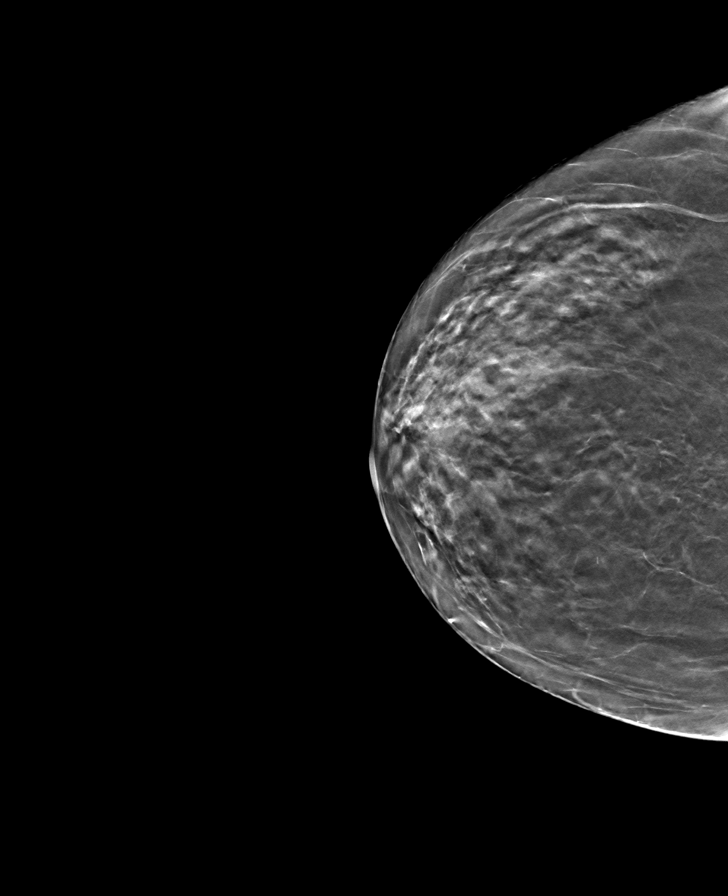

[8 of 24 positions shown; findings below may reference images not displayed]

ACR Breast Density Category c: The breast tissue is heterogeneously
dense, which may obscure small masses.
FINDINGS: There are no findings suspicious for malignancy. The images were
evaluated with computer-aided detection.
IMPRESSION: No mammographic evidence of malignancy. A result letter of this
screening mammogram will be mailed directly to the patient.

RECOMMENDATION:
Screening mammogram in one year. (Code:T4-5-GWO)

BI-RADS CATEGORY  1: Negative.

## 2021-07-24 ENCOUNTER — Telehealth: Payer: Self-pay | Admitting: Internal Medicine

## 2021-07-24 NOTE — Telephone Encounter (Signed)
Prolia approved patient scheduled. ?

## 2021-08-22 ENCOUNTER — Ambulatory Visit (INDEPENDENT_AMBULATORY_CARE_PROVIDER_SITE_OTHER): Payer: Medicare Other | Admitting: Internal Medicine

## 2021-08-22 ENCOUNTER — Encounter: Payer: Self-pay | Admitting: Internal Medicine

## 2021-08-22 ENCOUNTER — Ambulatory Visit

## 2021-08-22 VITALS — BP 130/70 | HR 68 | Temp 97.7°F | Resp 14 | Ht 59.0 in | Wt 136.8 lb

## 2021-08-22 DIAGNOSIS — I10 Essential (primary) hypertension: Secondary | ICD-10-CM | POA: Diagnosis not present

## 2021-08-22 DIAGNOSIS — E039 Hypothyroidism, unspecified: Secondary | ICD-10-CM | POA: Diagnosis not present

## 2021-08-22 DIAGNOSIS — N1831 Chronic kidney disease, stage 3a: Secondary | ICD-10-CM

## 2021-08-22 DIAGNOSIS — R7303 Prediabetes: Secondary | ICD-10-CM

## 2021-08-22 DIAGNOSIS — E785 Hyperlipidemia, unspecified: Secondary | ICD-10-CM

## 2021-08-22 DIAGNOSIS — E559 Vitamin D deficiency, unspecified: Secondary | ICD-10-CM

## 2021-08-22 DIAGNOSIS — Z1389 Encounter for screening for other disorder: Secondary | ICD-10-CM

## 2021-08-22 DIAGNOSIS — M810 Age-related osteoporosis without current pathological fracture: Secondary | ICD-10-CM

## 2021-08-22 DIAGNOSIS — Z1231 Encounter for screening mammogram for malignant neoplasm of breast: Secondary | ICD-10-CM

## 2021-08-22 LAB — COMPREHENSIVE METABOLIC PANEL
ALT: 12 U/L (ref 0–35)
AST: 17 U/L (ref 0–37)
Albumin: 4 g/dL (ref 3.5–5.2)
Alkaline Phosphatase: 66 U/L (ref 39–117)
BUN: 26 mg/dL — ABNORMAL HIGH (ref 6–23)
CO2: 30 mEq/L (ref 19–32)
Calcium: 9.3 mg/dL (ref 8.4–10.5)
Chloride: 101 mEq/L (ref 96–112)
Creatinine, Ser: 0.98 mg/dL (ref 0.40–1.20)
GFR: 55.05 mL/min — ABNORMAL LOW (ref >60.00)
Glucose, Bld: 101 mg/dL — ABNORMAL HIGH (ref 70–99)
Potassium: 4.2 mEq/L (ref 3.5–5.1)
Sodium: 137 mEq/L (ref 135–145)
Total Bilirubin: 0.6 mg/dL (ref 0.2–1.2)
Total Protein: 6.7 g/dL (ref 6.0–8.3)

## 2021-08-22 LAB — CBC WITH DIFFERENTIAL/PLATELET
Basophils Absolute: 0 10*3/uL (ref 0.0–0.1)
Basophils Relative: 0.5 % (ref 0.0–3.0)
Eosinophils Absolute: 0.2 10*3/uL (ref 0.0–0.7)
Eosinophils Relative: 2.9 % (ref 0.0–5.0)
HCT: 38.9 % (ref 36.0–46.0)
Hemoglobin: 12.9 g/dL (ref 12.0–15.0)
Lymphocytes Relative: 22.7 % (ref 12.0–46.0)
Lymphs Abs: 1.4 10*3/uL (ref 0.7–4.0)
MCHC: 33.1 g/dL (ref 30.0–36.0)
MCV: 96.8 fl (ref 78.0–100.0)
Monocytes Absolute: 0.6 10*3/uL (ref 0.1–1.0)
Monocytes Relative: 9.5 % (ref 3.0–12.0)
Neutro Abs: 3.9 10*3/uL (ref 1.4–7.7)
Neutrophils Relative %: 64.4 % (ref 43.0–77.0)
Platelets: 210 10*3/uL (ref 150.0–400.0)
RBC: 4.01 Mil/uL (ref 3.87–5.11)
RDW: 14.3 % (ref 11.5–15.5)
WBC: 6.1 10*3/uL (ref 4.0–10.5)

## 2021-08-22 LAB — LIPID PANEL
Cholesterol: 131 mg/dL (ref 0–200)
HDL: 55.8 mg/dL (ref >39.00)
LDL Cholesterol: 58 mg/dL (ref 0–99)
NonHDL: 74.8
Total CHOL/HDL Ratio: 2
Triglycerides: 82 mg/dL (ref 0.0–149.0)
VLDL: 16.4 mg/dL (ref 0.0–40.0)

## 2021-08-22 LAB — TSH: TSH: 1.35 u[IU]/mL (ref 0.35–5.50)

## 2021-08-22 LAB — HEMOGLOBIN A1C: Hgb A1c MFr Bld: 6.1 % (ref 4.6–6.5)

## 2021-08-22 LAB — VITAMIN D 25 HYDROXY (VIT D DEFICIENCY, FRACTURES): VITD: 78.4 ng/mL (ref 30.00–100.00)

## 2021-08-22 MED ORDER — LOSARTAN POTASSIUM 50 MG PO TABS: 50.0000 mg | ORAL_TABLET | Freq: Two times a day (BID) | ORAL | 3 refills | Status: DC

## 2021-08-22 MED ORDER — CARVEDILOL 12.5 MG PO TABS: 12.5000 mg | ORAL_TABLET | Freq: Two times a day (BID) | ORAL | 3 refills | Status: DC

## 2021-08-22 MED ORDER — DENOSUMAB 60 MG/ML ~~LOC~~ SOSY
60.0000 mg | PREFILLED_SYRINGE | Freq: Once | SUBCUTANEOUS | Status: AC
  Administered 2021-08-22: 60 mg via SUBCUTANEOUS

## 2021-08-22 MED ORDER — ROSUVASTATIN CALCIUM 20 MG PO TABS: 20.0000 mg | ORAL_TABLET | Freq: Every day | ORAL | 3 refills | Status: DC

## 2021-08-22 MED ORDER — LEVOTHYROXINE SODIUM 88 MCG PO TABS
88.0000 ug | ORAL_TABLET | Freq: Every day | ORAL | 3 refills | Status: DC
Start: 2021-08-22 — End: 2022-09-04

## 2021-08-22 NOTE — Progress Notes (Signed)
Chief Complaint  Patient presents with   Follow-up    6 mon, no concerns denies any pain. Prolia injection given today, next one due on or after 02/22/22   F/u  1. Osteoporosis due for prolia shot given today no other concerns  2. Hypothyroidism on levo 88 mcg qd  3. Htn controlled on coreg 12.5 mg bid and losartan 5 68m bid  Review of Systems  Constitutional:  Negative for weight loss.  HENT:  Negative for hearing loss.   Eyes:  Negative for blurred vision.  Respiratory:  Negative for shortness of breath.   Cardiovascular:  Negative for chest pain.  Gastrointestinal:  Negative for abdominal pain and blood in stool.  Genitourinary:  Negative for dysuria.  Musculoskeletal:  Negative for falls and joint pain.  Skin:  Negative for rash.  Neurological:  Negative for headaches.  Psychiatric/Behavioral:  Negative for depression.   Past Medical History:  Diagnosis Date   Asthma    childhood   Chicken pox    Chronic back pain    Said spontaneous thoracic and lumbar spine fractures secondary to falls.   Colon polyps    Coronary artery disease involving native coronary artery without angina pectoris 07/2016   In setting of inferior STEMI, cardiac cath showed occluded RCA.  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  [place]. ->  Both aspirin and Plavix were stopped. 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 (no stents)    Hyperlipidemia with target LDL less than 70    On rosuvastatin   Hypothyroidism (acquired)    On Synthroid   Intracranial hemorrhage (HCC)    s/p fall years ago when on ? 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 [facility]abra Heckin [facility]    08/01/21 this is 3rd eye surgery over 5 years as of 08/21/21   [facility]   RIGHT AND LEFT HEART CATH  07/2016   Inferior STEMI.  Culprit lesion mRPDA = was too small to address.  20% pLAD.  LCx-LPL normal.  Patent.  RCA ostial 30% RCA dominant.  Hazy plaque rupture and a small 1.5 mm PDA. --> LV Gram EF 50%, Inf HK. Increased LVEDP   TONSILLECTOMY     TRANSTHORACIC ECHOCARDIOGRAM  11/2015   Right is being within normal limits.   Family History  Problem Relation Age of Onset   Other Mother        Old age   Hearing loss Mother    Cancer Father        colon died in 160  CVA Father    Lupus Sister    Lung cancer Brother    Cancer Brother        lung smoker   Heart disease Maternal Grandmother    Dementia Sister    Colon cancer Brother    Cancer Brother        colon   Cancer Son        testicular cancer dx age 42 now 64 as of 12/2018    Cancer Sister        skin   Breast cancer Neg Hx    Social History   Socioeconomic History   Marital status: Single    Spouse name: Not on file   Number of children: 2   Years of education: Not on file   Highest education level: High school graduate  Occupational History   Occupation: Nanny  Tobacco Use   Smoking status: Never   Smokeless tobacco: Never  Vaping Use   Vaping Use: Never used  Substance and Sexual Activity   Alcohol use: Never   Drug use: Never   Sexual activity: Not Currently  Other Topics Concern   Not on file  Social History Narrative   Recently moved from [place], [place] (near  [place]  Now lives in [place], [place]   Lives alone, but very close to her son Fara Olden and his wife.   Her daughter lives in [place], [place] 1 biological    Has adopted kids x 2    She has 3 grandchildren.   widow      She drinks coffee.     She does water aerobics/pool exercises.      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  Prior to moving to [place], she was the primary caregiver for her brother who passed away in 2017/05/21.  Once he died, her children convinced her to move up to [place] to be near them.      6 other siblings 2 1/2 siblings but those 1/2 siblings died in young age   Social Determinants of Health   Financial Resource Strain: Low Risk    Difficulty of Paying Living Expenses: Not hard at all  Food Insecurity: No Food Insecurity   Worried About Charity fundraiser in the Last Year: Never true   Arboriculturist in the Last Year: Never true  Transportation Needs: No Transportation Needs   Lack of Transportation (Medical): No   Lack of Transportation (Non-Medical): No  Physical Activity: Insufficiently Active   Days of Exercise per Week: 4 days   Minutes of Exercise per Session: 20 min  Stress: No Stress Concern Present   Feeling of Stress : Not at all  Social Connections: Unknown   Frequency of Communication with Friends and Family: More than three times a week   Frequency of Social Gatherings with Friends and Family: More than three times a week   Attends Religious Services: Not on file   Active Member of Clubs or Organizations: Not on file   Attends Archivist Meetings: Not on file   Marital Status: Widowed  Human resources officer Violence: Not At Risk   Fear of Current or Ex-Partner: No   Emotionally Abused: No   Physically Abused: No   Sexually Abused: No   Current Meds  Medication Sig   aspirin 81 MG chewable tablet Chew by mouth daily.   moxifloxacin (VIGAMOX) 0.5 % ophthalmic solution Place 1 drop into the left eye in the morning, at noon,  in the evening, and at  bedtime.   prednisoLONE acetate (PRED FORTE) 1 % ophthalmic suspension 1 Drop(s) Left Eye 4 Times Daily   [DISCONTINUED] carvedilol (COREG) 12.5 MG tablet Take 1 tablet (12.5 mg total) by mouth 2 (two) times daily with a meal.   [DISCONTINUED] levothyroxine (SYNTHROID) 88 MCG tablet Take 1 tablet (88 mcg total) by mouth daily before breakfast.   [DISCONTINUED] losartan (COZAAR) 50 MG tablet Take 1 tablet (50 mg total) by mouth 2 (two) times daily.   [DISCONTINUED] rosuvastatin (CRESTOR) 20 MG tablet Take 1 tablet (20 mg total) by mouth daily.   Current Facility-Administered Medications for the 08/22/21 encounter (Office Visit) with McLean-Scocuzza, [name], MD  Medication   denosumab (PROLIA) injection 60 mg   Allergies  Allergen Reactions   Chocolate     H/a    Lactose Intolerance (Gi)    No results found for this or any previous visit (from the past 2160 hour(s)). Objective  Body mass index is 27.63 kg/m. Wt Readings from Last 3 Encounters:  08/22/21 136 lb 12.8 oz (62.1 kg)  04/14/21 142 lb (64.4 kg)  02/21/21 142 lb 3.2 oz (64.5 kg)   Temp Readings from Last 3 Encounters:  08/22/21 97.7 F (36.5 C) (Oral)  02/21/21 (!) 97.5 F (36.4 C) (Temporal)  08/29/20 (!) 97.2 F (36.2 C) (Temporal)   BP Readings from Last 3 Encounters:  08/22/21 130/70  02/21/21 124/80  08/29/20 130/72   Pulse Readings from Last 3 Encounters:  08/22/21 68  02/21/21 77  08/29/20 65    Physical Exam Vitals and nursing note reviewed.  Constitutional:      Appearance: Normal appearance. She is well-developed and well-groomed.  HENT:     Head: Normocephalic and atraumatic.  Eyes:     Conjunctiva/sclera: Conjunctivae normal.     Pupils: Pupils are equal, round, and reactive to light.  Cardiovascular:     Rate and Rhythm: Normal rate and regular rhythm.     Heart sounds: Normal heart sounds. No murmur heard. Pulmonary:     Effort: Pulmonary effort is normal.     Breath sounds: Normal  breath sounds.  Abdominal:     General: Abdomen is flat. Bowel sounds are normal.     Tenderness: There is no abdominal tenderness.  Musculoskeletal:        General: No tenderness.  Skin:    General: Skin is warm and dry.  Neurological:     General: No focal deficit present.     Mental Status: She is alert and oriented to person, place, and time. Mental status is at baseline.     Cranial Nerves: Cranial nerves 2-12 are intact.     Motor: Motor function is intact.     Coordination: Coordination is intact.     Gait: Gait is intact.  Psychiatric:        Attention and Perception: Attention and perception normal.        Mood and Affect: Mood and affect normal.        Speech: Speech normal.        Behavior: Behavior normal. Behavior is cooperative.        Thought Content: Thought content normal.        Cognition and Memory: Cognition and memory normal.        Judgment: Judgment normal.    Assessment  Plan  Osteoporosis, unspecified osteoporosis type, unspecified pathological fracture presence - Plan: denosumab (PROLIA) injection 60 mg  Hyperlipidemia, unspecified hyperlipidemia type -  Plan: rosuvastatin (CRESTOR) 20 MG tablet  Essential hypertension controlled - Plan: Comprehensive metabolic panel, Lipid panel, CBC with Differential/Platelet Coreg 12.5 mg bid and losartan 50 mg bid   Hypothyroidism, unspecified type - Plan: TSH Levo 88 mcg qd   Prediabetes - Plan: Hemoglobin A1c  Vitamin D deficiency - Plan: Vitamin D (25 hydroxy) HM Declines flu shot and other vaccines prev  J&J x 1, moderna 03/01/20 declines ? Related to left eye lens rejection/infection started 3 days after moderna booster  pna shot per pt had in 2016 ? Which one  Had zostervax at [facility] in [place] declines shingrix  Declines vaccines I.e covid  vx prev declined Tdap, prevnar    Mammogram 03/11/19 neg shc 06/29/20 negative Du e sch 09/20/21 mammo and dexa ordered mammo for 08/2022   Colonoscopy with h/o polyps  in 2015 last age 53 did not do and may like to do stool kit in future  12/10/19 bx colonoscopy negative    S/p hysterectomy with BSO 1989 DUB endometrosis out of age window    DEXA 03/11/19 osteopenia on prolia had inj 06/16/20+   -DEXA 09/2015 T score -2.5 +compression fractures T12 and L1-4  ? Had 06/12/16 cant read copy h/o osteopenia/porosis with comp. fxs need to get copy of dexa was on prolia last shot 04/2018   -prolia injection had 05/26/2018, 10/10/17, 04/01/17, 09/24/16   -she was also on forteo 08/07/16    Derm same day   Former PCP Dr. Lubertha Basque in [place] [phone] (get copy of labs, vaccines, mammogram, colonoscopy, DEXA, imaging) release faxed  -2nd request 01/30/2019    Reviewed records Dr. August Saucer I.e labs and Dr. Jimmye Norman  Compression fx T12, L1-4      Cards Dr. Glenetta Hew in [place]  Dr. Brigitte Pulse eye in [place]  Endocrine in [facility] [phone] Eye surgery in [facility] Dr. Sabra Heck >[facility] Dr. Adonis Housekeeper seen 06/08/20 f/u in 6 weeks  [facility]    Provider: Dr. [name] McLean-Scocuzza-Internal Medicine

## 2021-08-23 LAB — URINALYSIS, ROUTINE W REFLEX MICROSCOPIC
Bacteria, UA: NONE SEEN /HPF
Bilirubin Urine: NEGATIVE
Glucose, UA: NEGATIVE
Hgb urine dipstick: NEGATIVE
Hyaline Cast: NONE SEEN /LPF
Ketones, ur: NEGATIVE
Leukocytes,Ua: NEGATIVE
Nitrite: NEGATIVE
Specific Gravity, Urine: 1.021 (ref 1.001–1.035)
pH: 5.5 (ref 5.0–8.0)

## 2021-08-23 LAB — MICROSCOPIC MESSAGE

## 2021-09-18 ENCOUNTER — Ambulatory Visit (INDEPENDENT_AMBULATORY_CARE_PROVIDER_SITE_OTHER): Payer: Medicare Other | Admitting: Family Medicine

## 2021-09-18 ENCOUNTER — Encounter: Payer: Self-pay | Admitting: Family Medicine

## 2021-09-18 VITALS — BP 118/78 | HR 64 | Temp 98.2°F | Wt 136.8 lb

## 2021-09-18 DIAGNOSIS — R300 Dysuria: Secondary | ICD-10-CM

## 2021-09-18 HISTORY — DX: Dysuria: R30.0

## 2021-09-18 LAB — URINALYSIS, MICROSCOPIC ONLY

## 2021-09-18 LAB — POCT URINALYSIS DIPSTICK
Bilirubin, UA: NEGATIVE
Blood, UA: POSITIVE
Glucose, UA: NEGATIVE
Ketones, UA: NEGATIVE
Nitrite, UA: NEGATIVE
Protein, UA: POSITIVE — AB
Spec Grav, UA: 1.01 (ref 1.010–1.025)
Urobilinogen, UA: 0.2 E.U./dL
pH, UA: 6 (ref 5.0–8.0)

## 2021-09-18 MED ORDER — CEFDINIR 300 MG PO CAPS: 300.0000 mg | ORAL_CAPSULE | Freq: Two times a day (BID) | ORAL | 0 refills | Status: DC

## 2021-09-18 NOTE — Progress Notes (Signed)
  Amy Alar, MD Phone: [phone]  Amy Montes is a 81 y.o. female who presents today for same day visit.   UTI: onset 2 days ago.  Dysuria- yes  Frequency- yes   Urgency- yes   Hematuria- no   Abd pain-slight suprapubic discomfort   Vaginal d/c- no    Social History   Tobacco Use  Smoking Status Never  Smokeless Tobacco Never    Current Outpatient Medications on File Prior to Visit  Medication Sig Dispense Refill   aspirin 81 MG chewable tablet Chew by mouth daily.     carvedilol (COREG) 12.5 MG tablet Take 1 tablet (12.5 mg total) by mouth 2 (two) times daily with a meal. 180 tablet 3   levothyroxine (SYNTHROID) 88 MCG tablet Take 1 tablet (88 mcg total) by mouth daily before breakfast. 90 tablet 3   losartan (COZAAR) 50 MG tablet Take 1 tablet (50 mg total) by mouth 2 (two) times daily. 180 tablet 3   moxifloxacin (VIGAMOX) 0.5 % ophthalmic solution Place 1 drop into the left eye in the morning, at noon, in the evening, and at bedtime.     prednisoLONE acetate (PRED FORTE) 1 % ophthalmic suspension 1 Drop(s) Left Eye 4 Times Daily     rosuvastatin (CRESTOR) 20 MG tablet Take 1 tablet (20 mg total) by mouth daily. 90 tablet 3   Current Facility-Administered Medications on File Prior to Visit  Medication Dose Route Frequency Provider Last Rate Last Admin   denosumab (PROLIA) injection 60 mg  60 mg Subcutaneous Q6 months McLean-Scocuzza, Pasty Spillers, MD   60 mg at 02/21/21 0749     ROS see history of present illness  Objective  Physical Exam Vitals:   09/18/21 1115  BP: 118/78  Pulse: 64  Temp: 98.2 F (36.8 C)  SpO2: 97%    BP Readings from Last 3 Encounters:  09/18/21 118/78  08/22/21 130/70  02/21/21 124/80   Wt Readings from Last 3 Encounters:  09/18/21 136 lb 12.8 oz (62.1 kg)  08/22/21 136 lb 12.8 oz (62.1 kg)  04/14/21 142 lb (64.4 kg)    Physical Exam Abdominal:     General: Bowel sounds are normal. There is no distension.      Palpations: Abdomen is soft.     Tenderness: There is no abdominal tenderness.      Assessment/Plan: Please see individual problem list.  Problem List Items Addressed This Visit     Dysuria - Primary    Concerning for UTI based on symptoms and UA.  We will treat with Omnicef 300 mg twice daily for 5 days.  She was counseled on the risk of diarrhea with antibiotics.  If she is not improving with this she will let us know.      Relevant Medications   cefdinir (OMNICEF) 300 MG capsule   Other Relevant Orders   Urine Culture   POCT Urinalysis Dipstick (Completed)   Urine Microscopic    Return if symptoms worsen or fail to improve.   Amy Alar, MD [facility]

## 2021-09-19 LAB — URINE CULTURE
MICRO NUMBER:: 13571872
Result:: NO GROWTH
SPECIMEN QUALITY:: ADEQUATE

## 2021-09-20 ENCOUNTER — Other Ambulatory Visit: Payer: Self-pay | Admitting: Family Medicine

## 2021-09-20 ENCOUNTER — Ambulatory Visit
Admission: RE | Admit: 2021-09-20 | Discharge: 2021-09-20 | Disposition: A | Discharge status: Home / Self Care | Payer: Medicare Other | Source: Ambulatory Visit | Attending: Internal Medicine | Admitting: Internal Medicine

## 2021-09-20 DIAGNOSIS — M810 Age-related osteoporosis without current pathological fracture: Secondary | ICD-10-CM | POA: Diagnosis present

## 2021-09-20 DIAGNOSIS — Z1231 Encounter for screening mammogram for malignant neoplasm of breast: Secondary | ICD-10-CM | POA: Diagnosis present

## 2021-09-20 DIAGNOSIS — R809 Proteinuria, unspecified: Secondary | ICD-10-CM

## 2021-09-20 DIAGNOSIS — R300 Dysuria: Secondary | ICD-10-CM

## 2021-09-25 NOTE — Addendum Note (Signed)
Addended by: Leone Haven on: 09/25/2021 09:43 AM   Modules accepted: Orders

## 2021-09-28 ENCOUNTER — Other Ambulatory Visit (INDEPENDENT_AMBULATORY_CARE_PROVIDER_SITE_OTHER): Payer: Medicare Other

## 2021-09-28 DIAGNOSIS — R300 Dysuria: Secondary | ICD-10-CM

## 2021-09-28 DIAGNOSIS — R809 Proteinuria, unspecified: Secondary | ICD-10-CM

## 2021-09-28 LAB — URINALYSIS, ROUTINE W REFLEX MICROSCOPIC
Bilirubin Urine: NEGATIVE
Hgb urine dipstick: NEGATIVE
Ketones, ur: NEGATIVE
Leukocytes,Ua: NEGATIVE
Nitrite: NEGATIVE
RBC / HPF: NONE SEEN (ref 0–?)
Specific Gravity, Urine: 1.01 (ref 1.000–1.030)
Total Protein, Urine: NEGATIVE
Urine Glucose: NEGATIVE
Urobilinogen, UA: 0.2 (ref 0.0–1.0)
pH: 7 (ref 5.0–8.0)

## 2021-09-29 ENCOUNTER — Other Ambulatory Visit: Payer: Self-pay | Admitting: Family Medicine

## 2021-09-29 DIAGNOSIS — R300 Dysuria: Secondary | ICD-10-CM

## 2021-09-30 LAB — PROTEIN / CREATININE RATIO, URINE
Creatinine, Urine: 88 mg/dL (ref 20–275)
Protein/Creat Ratio: 250 mg/g creat — ABNORMAL HIGH (ref 24–184)
Protein/Creatinine Ratio: 0.25 mg/mg creat — ABNORMAL HIGH (ref 0.024–0.184)
Total Protein, Urine: 22 mg/dL (ref 5–24)

## 2021-09-30 LAB — URINE CULTURE
MICRO NUMBER:: 13612177
SPECIMEN QUALITY:: ADEQUATE

## 2021-10-02 ENCOUNTER — Other Ambulatory Visit: Payer: Self-pay | Admitting: Family Medicine

## 2021-10-02 DIAGNOSIS — R809 Proteinuria, unspecified: Secondary | ICD-10-CM

## 2021-10-04 ENCOUNTER — Ambulatory Visit (INDEPENDENT_AMBULATORY_CARE_PROVIDER_SITE_OTHER): Payer: Medicare Other | Admitting: Urology

## 2021-10-04 ENCOUNTER — Other Ambulatory Visit: Payer: Self-pay | Admitting: *Deleted

## 2021-10-04 ENCOUNTER — Other Ambulatory Visit
Admission: RE | Admit: 2021-10-04 | Discharge: 2021-10-04 | Disposition: A | Discharge status: Home / Self Care | Payer: Medicare Other | Attending: Urology | Admitting: Urology

## 2021-10-04 ENCOUNTER — Encounter: Payer: Self-pay | Admitting: Urology

## 2021-10-04 VITALS — BP 135/75 | HR 75 | Ht 59.0 in | Wt 137.8 lb

## 2021-10-04 DIAGNOSIS — R350 Frequency of micturition: Secondary | ICD-10-CM | POA: Diagnosis not present

## 2021-10-04 DIAGNOSIS — N3941 Urge incontinence: Secondary | ICD-10-CM

## 2021-10-04 DIAGNOSIS — R300 Dysuria: Secondary | ICD-10-CM | POA: Diagnosis present

## 2021-10-04 DIAGNOSIS — N3000 Acute cystitis without hematuria: Secondary | ICD-10-CM

## 2021-10-04 DIAGNOSIS — R3915 Urgency of urination: Secondary | ICD-10-CM

## 2021-10-04 DIAGNOSIS — N3281 Overactive bladder: Secondary | ICD-10-CM

## 2021-10-04 DIAGNOSIS — R102 Pelvic and perineal pain: Secondary | ICD-10-CM

## 2021-10-04 LAB — URINALYSIS, COMPLETE (UACMP) WITH MICROSCOPIC
Bilirubin Urine: NEGATIVE
Glucose, UA: NEGATIVE mg/dL
Ketones, ur: NEGATIVE mg/dL
Leukocytes,Ua: NEGATIVE
Nitrite: NEGATIVE
Protein, ur: NEGATIVE mg/dL
Specific Gravity, Urine: 1.01 (ref 1.005–1.030)
pH: 6.5 (ref 5.0–8.0)

## 2021-10-04 MED ORDER — MIRABEGRON ER 50 MG PO TB24: 50.0000 mg | ORAL_TABLET | Freq: Every day | ORAL | 0 refills | Status: DC

## 2021-10-04 NOTE — Progress Notes (Signed)
10/04/21 2:04 PM   Amy Montes 08-15-1940 [id number]  CC: Dysuria, UTI, overactive bladder, urge incontinence  HPI: 81 year old female who presented to PCP on 6/24 with severe acute onset of UTI type symptoms with dysuria, pelvic pain, urinary frequency and worsening urgency and urge incontinence.  Urinalysis showed pyuria with 11-20 WBCs and she was started on cefdinir.  Urine culture was ultimately negative, but her dysuria resolved with antibiotics.  She does have some persistent urgency and urge incontinence that is worse than baseline.  She denies any gross hematuria.  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  She denies any significant urinary problems since that time until recent UTI.  Urinalysis today is benign with 0-5 squamous cells, 0-5 WBCs, 0-5 RBC, rare bacteria.   PMH: Past Medical History:  Diagnosis Date   Asthma    childhood   Chicken pox    Chronic back pain    Said spontaneous thoracic and lumbar spine fractures secondary to falls.   Colon polyps    Coronary artery disease involving native coronary artery without angina pectoris 07/2016   In setting of inferior STEMI, cardiac cath showed occluded RCA.  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  Injured her face. ->  Both aspirin and Plavix were stopped. 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 (no stents)    Hyperlipidemia with target LDL less than 70    On rosuvastatin   Hypothyroidism (acquired)    On Synthroid   Intracranial hemorrhage (HCC)    s/p fall years ago when on ? 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 Sabra Heck in [place]     08/01/21 this is 3rd eye surgery over 5 years as of 08/21/21   CRANIOTOMY     [facility] evaccuation   RIGHT AND LEFT HEART CATH  07/2016   Inferior STEMI.  Culprit lesion mRPDA = was too small to address.  20% pLAD.  LCx-LPL normal.  Patent.  RCA ostial 30% RCA dominant.  Hazy plaque rupture and a small 1.5 mm PDA. --> LV Gram EF 50%, Inf HK. Increased LVEDP   TONSILLECTOMY     TRANSTHORACIC ECHOCARDIOGRAM  11/2015   Right is being within normal limits.    Family History: Family History  Problem Relation Age of Onset   Other Mother        Old age   Hearing loss Mother    Cancer Father        colon died  in 1961   CVA Father    Lupus Sister    Lung cancer Brother    Cancer Brother        lung smoker   Heart disease Maternal Grandmother    Dementia Sister    Colon cancer Brother    Cancer Brother        colon   Cancer Son        testicular cancer dx age 35 now 63 as of 12/2018    Cancer Sister        skin   Breast cancer Neg Hx     Social History:  reports that she has never smoked. She has never used smokeless tobacco. She reports that she does not drink alcohol and does not use drugs.  Physical Exam: BP 135/75 (BP Location: Left Arm, Patient Position: Sitting, Cuff Size: Normal)   Pulse 75   Ht '4\' 11"'$  (1.499 m)   Wt 137 lb 12.8 oz (62.5 kg)   BMI 27.83 kg/m    Constitutional:  Alert and oriented, No acute distress. Cardiovascular: No clubbing, cyanosis, or  edema. Respiratory: Normal respiratory effort, no increased work of breathing. GI: Abdomen is soft, nontender, nondistended, no abdominal masses  Laboratory Data: Reviewed  Pertinent Imaging: None to review  Assessment & Plan:   81 year old female with likely recent UTI despite negative culture with dysuria improved after course of antibiotics.  She has some baseline overactive bladder of urgency, urge incontinence, nocturia that seems to be worse than usual.  This may be related to recent UTI treatment, and I recommended a 1 month trial of Myrbetriq.  If this significantly improves her urgency, nocturia, and urge incontinence, she can continue this medication long-term.  Trial of Myrbetriq 50 mg daily RTC 1 month symptom check Consider cystoscopy or topical estrogen cream with her history of prior fistula  Nickolas Madrid, MD 10/04/2021  [place] [address] [place], [place] [address] [phone]

## 2021-10-04 NOTE — Patient Instructions (Signed)

## 2021-11-03 ENCOUNTER — Other Ambulatory Visit: Payer: TRICARE For Life (TFL)

## 2021-11-13 ENCOUNTER — Telehealth: Payer: Self-pay | Admitting: Urology

## 2021-11-13 NOTE — Telephone Encounter (Signed)
Called pt no answer. Last UA of record, shows no protein in the urine. Pt may discuss with Dr. Diamantina Providence at upcoming appt.

## 2021-11-13 NOTE — Telephone Encounter (Signed)
Pt wants to know if Diamantina Providence will do a protein urine prior to her appt in [place] on Wednesday

## 2021-11-15 ENCOUNTER — Ambulatory Visit (INDEPENDENT_AMBULATORY_CARE_PROVIDER_SITE_OTHER): Payer: Medicare Other | Admitting: Urology

## 2021-11-15 ENCOUNTER — Encounter: Payer: Self-pay | Admitting: Urology

## 2021-11-15 VITALS — BP 134/72 | HR 60 | Ht 59.0 in | Wt 135.0 lb

## 2021-11-15 DIAGNOSIS — N3281 Overactive bladder: Secondary | ICD-10-CM

## 2021-11-15 MED ORDER — TROSPIUM CHLORIDE ER 60 MG PO CP24
60.0000 mg | ORAL_CAPSULE | Freq: Every day | ORAL | 11 refills | Status: DC
Start: 2021-11-15 — End: 2021-12-08

## 2021-11-15 NOTE — Progress Notes (Signed)
   11/15/2021 12:09 PM   Amy Montes [birth year] [id number]  Reason for visit: Follow up overactive bladder, UTI  HPI: 81 year old female with 3 to 4 years of worsening frequency and nocturia, as well as urgency and urge incontinence requiring a pad here for follow-up.  I originally saw her in July after she had been treated for a possible UTI with PCP with dysuria and worsening of her frequency.  Antibiotics resolved the dysuria and discomfort, but she had some persistent urgency and urge incontinence that was worse than baseline.  Her history is also notable for a hysterectomy at age 82 for endometriosis that was complicated by a bladder fistula and required 3 months of catheter drainage.  She had not had significant urinary problems until the recent UTI.  At her last visit her urinalysis was benign and we trialed Myrbetriq 50 mg daily.  She did not notice any improvement in the urinary symptoms on that medication.  She is drinking 1 cup of coffee in the morning, and primarily water during the day.  She is having nocturia 4-5 times at night which is slightly worse than her baseline of 3 times at night.  PVR is normal today at 34 mL.  We discussed that overactive bladder (OAB) is not a disease, but is a symptom complex that is generally not life-threatening.  Symptoms typically include urinary urgency, frequency, and urge incontinence.  There are numerous treatment options, however there are risks and benefits with both medical and surgical management.  First-line treatment is behavioral therapies including bladder training, pelvic floor muscle training, and fluid management.  Second line treatments include oral antimuscarinics(Ditropan er, Trospium) and beta-3 agonist (Mybetriq). There is typically a period of medication trial (4-8 weeks) to find the optimal therapy and dosing. If symptoms are bothersome despite the above management, third line options include intra-detrusor botox, peripheral  tibial nerve stimulation (PTNS), and interstim (SNS). These are more invasive treatments with higher side effect profile, but may improve quality of life for patients with severe OAB symptoms.   -Trial of trospium 60 mg daily for OAB-she will discuss with her ophthalmologist prior to starting with her eye issues and risk of dry eye with trospium -RTC 6 weeks cystoscopy to evaluate for endometriosis or recurrent fistula, can cancel unchanged office visit if symptoms improved/resolved on trospium  [place], [place] [address], [place], [place] [address] [phone]

## 2021-11-15 NOTE — Patient Instructions (Signed)

## 2021-11-24 ENCOUNTER — Telehealth: Payer: Self-pay

## 2021-11-24 NOTE — Telephone Encounter (Signed)
PA for Trospium completed via Cover my meds. Approved dates: 11/24/2021 - 03/25/2098. Case ID # [id number]

## 2021-12-08 ENCOUNTER — Other Ambulatory Visit: Payer: Self-pay

## 2021-12-08 DIAGNOSIS — N3281 Overactive bladder: Secondary | ICD-10-CM

## 2021-12-08 MED ORDER — TROSPIUM CHLORIDE ER 60 MG PO CP24: 60.0000 mg | ORAL_CAPSULE | Freq: Every day | ORAL | 3 refills | Status: DC

## 2021-12-08 NOTE — Telephone Encounter (Signed)
Incoming request for 90 day supply, sent to pt's mail order pharmacy per request

## 2022-01-16 ENCOUNTER — Encounter: Payer: Self-pay | Admitting: Urology

## 2022-01-16 ENCOUNTER — Other Ambulatory Visit: Payer: Self-pay | Admitting: *Deleted

## 2022-01-16 ENCOUNTER — Other Ambulatory Visit
Admission: RE | Admit: 2022-01-16 | Discharge: 2022-01-16 | Disposition: A | Discharge status: Home / Self Care | Payer: Medicare Other | Attending: Urology | Admitting: Urology

## 2022-01-16 ENCOUNTER — Ambulatory Visit (INDEPENDENT_AMBULATORY_CARE_PROVIDER_SITE_OTHER): Payer: Medicare Other | Admitting: Urology

## 2022-01-16 VITALS — BP 131/73 | HR 79 | Ht 60.0 in | Wt 140.0 lb

## 2022-01-16 DIAGNOSIS — N3000 Acute cystitis without hematuria: Secondary | ICD-10-CM

## 2022-01-16 DIAGNOSIS — N3281 Overactive bladder: Secondary | ICD-10-CM

## 2022-01-16 DIAGNOSIS — R350 Frequency of micturition: Secondary | ICD-10-CM | POA: Diagnosis not present

## 2022-01-16 DIAGNOSIS — R3129 Other microscopic hematuria: Secondary | ICD-10-CM | POA: Diagnosis not present

## 2022-01-16 DIAGNOSIS — R351 Nocturia: Secondary | ICD-10-CM

## 2022-01-16 LAB — URINALYSIS, COMPLETE (UACMP) WITH MICROSCOPIC
Bilirubin Urine: NEGATIVE
Glucose, UA: NEGATIVE mg/dL
Ketones, ur: NEGATIVE mg/dL
Nitrite: NEGATIVE
Specific Gravity, Urine: 1.015 (ref 1.005–1.030)
pH: 6 (ref 5.0–8.0)

## 2022-01-16 MED ORDER — CEPHALEXIN 250 MG PO CAPS: 500.0000 mg | ORAL_CAPSULE | Freq: Every day | ORAL | Status: DC

## 2022-01-16 NOTE — Patient Instructions (Signed)

## 2022-01-16 NOTE — Progress Notes (Signed)
cep

## 2022-01-16 NOTE — Progress Notes (Signed)
Cystoscopy Procedure Note:  Indication: Irritative urinary symptoms, frequency, nocturia, persistent microscopic hematuria  Keflex given for prophylaxis  After informed consent and discussion of the procedure and its risks, Xandra T Redler was positioned and prepped in the standard fashion. Cystoscopy was performed with a flexible cystoscope. The urethra, bladder neck and entire bladder was visualized in a standard fashion. The ureteral orifices were visualized in their normal location and orientation.  Bladder mucosa was grossly normal throughout, no abnormalities on retroflexion  Findings: Normal cystoscopy  Assessment and Plan: Recommended behavioral strategies regarding nocturia including minimizing fluids before bed, elevation of the legs/compression socks regarding her lower extremity edema, continue trospium  Urine sent for culture and atypicals, call with results  If culture is negative recommend repeat follow-up in 3 months symptom check, repeat UA, if persistent microscopic hematuria consider upper tract imaging  Nickolas Madrid, MD 01/16/2022

## 2022-01-18 LAB — URINE CULTURE

## 2022-01-21 ENCOUNTER — Telehealth: Payer: Self-pay | Admitting: *Deleted

## 2022-01-21 NOTE — Telephone Encounter (Signed)
$  0 due for Prolia & no PA required  Patient scheduled to receive Prolia Injection at [facility] visit with Dr. Volanda Napoleon on 02/22/22

## 2022-01-22 LAB — MISC LABCORP TEST (SEND OUT): Labcorp test code: 86884

## 2022-02-22 ENCOUNTER — Encounter: Payer: Self-pay | Admitting: Family Medicine

## 2022-02-22 ENCOUNTER — Ambulatory Visit (INDEPENDENT_AMBULATORY_CARE_PROVIDER_SITE_OTHER): Payer: Medicare Other | Admitting: Family Medicine

## 2022-02-22 VITALS — BP 118/70 | HR 77 | Temp 98.4°F | Ht 60.0 in | Wt 137.8 lb

## 2022-02-22 DIAGNOSIS — M810 Age-related osteoporosis without current pathological fracture: Secondary | ICD-10-CM | POA: Diagnosis not present

## 2022-02-22 DIAGNOSIS — E039 Hypothyroidism, unspecified: Secondary | ICD-10-CM

## 2022-02-22 DIAGNOSIS — R7303 Prediabetes: Secondary | ICD-10-CM

## 2022-02-22 DIAGNOSIS — I2510 Atherosclerotic heart disease of native coronary artery without angina pectoris: Secondary | ICD-10-CM | POA: Diagnosis not present

## 2022-02-22 DIAGNOSIS — I10 Essential (primary) hypertension: Secondary | ICD-10-CM

## 2022-02-22 DIAGNOSIS — L821 Other seborrheic keratosis: Secondary | ICD-10-CM

## 2022-02-22 MED ORDER — DENOSUMAB 60 MG/ML ~~LOC~~ SOSY
60.0000 mg | PREFILLED_SYRINGE | Freq: Once | SUBCUTANEOUS | Status: AC
  Administered 2022-02-22: 60 mg via SUBCUTANEOUS

## 2022-02-22 NOTE — Patient Instructions (Addendum)
It was a pleasure meeting you today. Thank you for allowing me to take part in your health care.  Our goals for today as we discussed include:  For your blood pressure Decrease losartan to 50 mg at night. Monitor blood pressure for the next few days.  If your blood pressure stays below 120/80 continue with the losartan 50 mg at night. If your blood pressure rises to above 130/80, restart losartan 50 mg 2 times a day. Please MyChart me in 1 week to let me know your blood pressure results.   For your spot on your upper right thigh This is a benign spot that occurs with aging. Continue to monitor.  If you notice it getting bigger or any pain, itching or discomfort please follow-up sooner.   You received your Prolia injection today Follow-up in 6 months.  If you have any questions or concerns, please do not hesitate to call the office at [phone].  I look forward to our next visit and until then take care and stay safe.  Regards,   Carollee Leitz, MD   [facility]

## 2022-02-22 NOTE — Progress Notes (Signed)
SUBJECTIVE:   Chief Complaint  Patient presents with   Transitions Of Care    Prolia injection and refills.   HPI Patient presents to transfer care  Patient reports a spot on her right upper posterior thigh and has been there for about 2 months.  Denies any pain, itching, discomfort, fevers, increase in size, surrounding edema or bruising.  Reports that area feels a little bit rough  Osteoporosis No recent falls. Prolia injections every 6 months.  Dose due today.  Hypertension Asymptomatic.  Currently taking carvedilol 12.5 mg twice daily, losartan 50 mg twice daily and tolerating medications well.  Blood pressures at home range low 110s/60s-70s.  Denies any dizziness or weakness.  Requesting refills for medications.  Hyperlipidemia Takes Crestor 20 mg daily and tolerating medication well.  Hypothyroidism Asymptomatic.  Takes levothyroxine 88 mcg daily and tolerating well.  PERTINENT PMH / PSH: Osteopenia Hypertension CAD Asthma GERD Hyperlipidemia Hypothyroidism  OBJECTIVE:  BP 118/70   Pulse 77   Temp 98.4 F (36.9 C) (Oral)   Ht 5' (1.524 m)   Wt 137 lb 12.8 oz (62.5 kg)   SpO2 99%   BMI 26.91 kg/m    Physical Exam Vitals reviewed.  Constitutional:      General: She is not in acute distress.    Appearance: She is not ill-appearing.  HENT:     Head: Normocephalic.     Nose: Nose normal.  Eyes:     Conjunctiva/sclera: Conjunctivae normal.  Cardiovascular:     Rate and Rhythm: Normal rate and regular rhythm.     Heart sounds: Normal heart sounds.  Pulmonary:     Effort: Pulmonary effort is normal.     Breath sounds: Normal breath sounds.  Abdominal:     General: Abdomen is flat. Bowel sounds are normal.     Palpations: Abdomen is soft.  Musculoskeletal:        General: Normal range of motion.     Cervical back: Normal range of motion.  Skin:    Findings: Lesion (seborrheic keratosis, right upper posterior thigh) present.  Neurological:      Mental Status: She is alert and oriented to person, place, and time. Mental status is at baseline.  Psychiatric:        Mood and Affect: Mood normal.        Behavior: Behavior normal.        Thought Content: Thought content normal.        Judgment: Judgment normal.     ASSESSMENT/PLAN:  Osteoporosis, unspecified osteoporosis type, unspecified pathological fracture presence Assessment & Plan: Up-to-date with DEXA scan  Prolia injections q. 6 monthly Not currently taking calcium and vitamin D supplements.  Recommended initiation of recommended daily intake of calcium and 1200 mg daily and vitamin D 800 international units.  Orders: -     Denosumab  Coronary artery disease involving native coronary artery of native heart without angina pectoris Assessment & Plan: On statin therapy.  No myalgias Continue Crestor 20 mg daily.   Essential hypertension Assessment & Plan: Well-controlled on current antihypertensives.  Blood pressures soft for age.  Goal less than 150/90 per JNC 8 guidelines. Decrease Cozaar to 50 mg at night Continue carvedilol 12.5 mg twice daily Monitor blood pressure at home if returns back to greater than 130/80 can resume Cozaar 50 mg twice daily Strict return precautions provided.  Orders: -     Losartan Potassium; Take 1 tablet (50 mg total) by mouth daily.  Hypothyroidism, unspecified  type Assessment & Plan: TSH continues to trend down.  Most recent 1.35, slight improvement from previous check. Continue Synthroid 88 mcg daily Repeat TSH at next visit.  If continues to decrease we will need to adjust dose for advancing age.   Prediabetes Assessment & Plan: Recent A1c 6.1.  Prefer goal for age less between 7.5-8 to reduce risk of hypoglycemic events. Repeat A1c annually.   Seborrheic keratosis Assessment & Plan: Noted on right upper thigh posteriorly.   Reassurance provided likely benign lesion Will continue to monitor If increasing in size or  becomes bothersome plan for dermatology referral for biopsy.  Patient agreed with plan.     PDMP reviewed  No follow-ups on file.  Carollee Leitz, MD

## 2022-03-04 ENCOUNTER — Encounter: Payer: Self-pay | Admitting: Family Medicine

## 2022-03-04 DIAGNOSIS — L821 Other seborrheic keratosis: Secondary | ICD-10-CM | POA: Insufficient documentation

## 2022-03-04 MED ORDER — LOSARTAN POTASSIUM 50 MG PO TABS: 50.0000 mg | ORAL_TABLET | Freq: Every day | ORAL | Status: DC

## 2022-03-04 NOTE — Assessment & Plan Note (Signed)
Recent A1c 6.1.  Prefer goal for age less between 7.5-8 to reduce risk of hypoglycemic events. Repeat A1c annually.

## 2022-03-04 NOTE — Assessment & Plan Note (Signed)
Noted on right upper thigh posteriorly.   Reassurance provided likely benign lesion Will continue to monitor If increasing in size or becomes bothersome plan for dermatology referral for biopsy.  Patient agreed with plan.

## 2022-03-04 NOTE — Assessment & Plan Note (Signed)
TSH continues to trend down.  Most recent 1.35, slight improvement from previous check. Continue Synthroid 88 mcg daily Repeat TSH at next visit.  If continues to decrease we will need to adjust dose for advancing age.

## 2022-03-04 NOTE — Assessment & Plan Note (Signed)
On statin therapy.  No myalgias Continue Crestor 20 mg daily.

## 2022-03-04 NOTE — Assessment & Plan Note (Addendum)
Well-controlled on current antihypertensives.  Blood pressures soft for age.  Goal less than 150/90 per JNC 8 guidelines. Decrease Cozaar to 50 mg at night Continue carvedilol 12.5 mg twice daily Monitor blood pressure at home if returns back to greater than 130/80 can resume Cozaar 50 mg twice daily Strict return precautions provided.

## 2022-03-04 NOTE — Assessment & Plan Note (Signed)
Up-to-date with DEXA scan  Prolia injections q. 6 monthly Not currently taking calcium and vitamin D supplements.  Recommended initiation of recommended daily intake of calcium and 1200 mg daily and vitamin D 800 international units.

## 2022-03-05 ENCOUNTER — Telehealth: Payer: Self-pay | Admitting: Family Medicine

## 2022-03-05 NOTE — Telephone Encounter (Signed)
Patient stopped taking her losartan (COZAAR) 50 MG tablet 2x a day to 1x a day, per Dr Volanda Napoleon. Her lowest bp reading was 113/51 and the highest 127/70.

## 2022-03-07 NOTE — Telephone Encounter (Signed)
Decrease to 25 mg daily. Continue to monitor and reach out in a week to let me know results

## 2022-03-08 NOTE — Telephone Encounter (Signed)
I called and spoke with the patient and informed her taht the provider wants her to start taking 25 mg of the losartan and to call in her BP readings and she understood.  Bates [place],cma

## 2022-04-18 ENCOUNTER — Ambulatory Visit: Payer: Medicare Other | Admitting: Urology

## 2022-06-14 ENCOUNTER — Telehealth: Payer: Self-pay

## 2022-06-14 NOTE — Telephone Encounter (Signed)
Patient is requesting a device for her back for Osteoporosis or arthritis and I put it in the quick sign folder for you to review and sign.  Kajuana Shareef,cma

## 2022-06-18 ENCOUNTER — Telehealth: Payer: Self-pay

## 2022-06-18 NOTE — Telephone Encounter (Signed)
I called the patient and informed her that the provider received a form from [place] for a back and knee brace the patient stated it was a scam and she did not need neither one of them.  Informed the provider and it was shredded.  Shammond Arave,cma

## 2022-07-15 ENCOUNTER — Telehealth: Payer: Self-pay | Admitting: *Deleted

## 2022-07-15 NOTE — Telephone Encounter (Signed)
$  0 due; PA not required. Prolia due on or after 08/23/22.

## 2022-07-16 NOTE — Telephone Encounter (Signed)
Prolia Inj scheduled on 08/27/22 @ 9:30-pt aware

## 2022-08-12 NOTE — Progress Notes (Deleted)
   SUBJECTIVE:   No chief complaint on file.  HPI Patient presents to transfer care  Patient reports a spot on her right upper posterior thigh and has been there for about 2 months.  Denies any pain, itching, discomfort, fevers, increase in size, surrounding edema or bruising.  Reports that area feels a little bit rough  Osteoporosis No recent falls. Prolia injections every 6 months.  Dose due today.  Hypertension Asymptomatic.  Currently taking carvedilol 12.5 mg twice daily, losartan 50 mg twice daily and tolerating medications well.  Blood pressures at home range low 110s/60s-70s.  Denies any dizziness or weakness.  Requesting refills for medications.  Hyperlipidemia Takes Crestor 20 mg daily and tolerating medication well.  Hypothyroidism Asymptomatic.  Takes levothyroxine 88 mcg daily and tolerating well.  PERTINENT PMH / PSH: Osteopenia Hypertension CAD Asthma GERD Hyperlipidemia Hypothyroidism  OBJECTIVE:  There were no vitals taken for this visit.   Physical Exam Vitals reviewed.  Constitutional:      General: She is not in acute distress.    Appearance: She is not ill-appearing.  HENT:     Head: Normocephalic.     Nose: Nose normal.  Eyes:     Conjunctiva/sclera: Conjunctivae normal.  Cardiovascular:     Rate and Rhythm: Normal rate and regular rhythm.     Heart sounds: Normal heart sounds.  Pulmonary:     Effort: Pulmonary effort is normal.     Breath sounds: Normal breath sounds.  Abdominal:     General: Abdomen is flat. Bowel sounds are normal.     Palpations: Abdomen is soft.  Musculoskeletal:        General: Normal range of motion.     Cervical back: Normal range of motion.  Skin:    Findings: Lesion (seborrheic keratosis, right upper posterior thigh) present.  Neurological:     Mental Status: She is alert and oriented to person, place, and time. Mental status is at baseline.  Psychiatric:        Mood and Affect: Mood normal.         Behavior: Behavior normal.        Thought Content: Thought content normal.        Judgment: Judgment normal.     ASSESSMENT/PLAN:  Prediabetes  Hypothyroidism, unspecified type  Essential hypertension  Hyperlipidemia, unspecified hyperlipidemia type    PDMP reviewed  No follow-ups on file.  Dana Allan, MD

## 2022-08-13 ENCOUNTER — Ambulatory Visit: Payer: TRICARE For Life (TFL) | Admitting: Family Medicine

## 2022-08-13 DIAGNOSIS — E039 Hypothyroidism, unspecified: Secondary | ICD-10-CM

## 2022-08-13 DIAGNOSIS — R7303 Prediabetes: Secondary | ICD-10-CM

## 2022-08-13 DIAGNOSIS — I10 Essential (primary) hypertension: Secondary | ICD-10-CM

## 2022-08-13 DIAGNOSIS — E785 Hyperlipidemia, unspecified: Secondary | ICD-10-CM

## 2022-08-21 ENCOUNTER — Ambulatory Visit (INDEPENDENT_AMBULATORY_CARE_PROVIDER_SITE_OTHER): Payer: Medicare Other

## 2022-08-21 VITALS — Ht 60.0 in | Wt 137.0 lb

## 2022-08-21 DIAGNOSIS — Z0000 Encounter for general adult medical examination without abnormal findings: Secondary | ICD-10-CM

## 2022-08-21 NOTE — Patient Instructions (Signed)
Amy Montes , Thank you for taking time to come for your Medicare Wellness Visit. I appreciate your ongoing commitment to your health goals. Please review the following plan we discussed and let me know if I can assist you in the future.   These are the goals we discussed:  Goals      DIET - EAT MORE FRUITS AND VEGETABLES     Follow up with Primary Care Provider     As needed        This is a list of the screening recommended for you and due dates:  Health Maintenance  Topic Date Due   Zoster (Shingles) Vaccine (1 of 2) Never done   COVID-19 Vaccine (2 - Janssen risk series) 03/29/2020   Pneumonia Vaccine (1 of 1 - PCV) 02/23/2023*   Flu Shot  10/25/2022   Medicare Annual Wellness Visit  08/21/2023   DEXA scan (bone density measurement)  Completed   HPV Vaccine  Aged Out   DTaP/Tdap/Td vaccine  Discontinued   Hepatitis C Screening  Discontinued  *Topic was postponed. The date shown is not the original due date.    Advanced directives: no  Conditions/risks identified: none  Next appointment: Follow up in one year for your annual wellness visit 08/23/23 @ 9:45 am by phone   Preventive Care 65 Years and Older, Female Preventive care refers to lifestyle choices and visits with your health care provider that can promote health and wellness. What does preventive care include? A yearly physical exam. This is also called an annual well check. Dental exams once or twice a year. Routine eye exams. Ask your health care provider how often you should have your eyes checked. Personal lifestyle choices, including: Daily care of your teeth and gums. Regular physical activity. Eating a healthy diet. Avoiding tobacco and drug use. Limiting alcohol use. Practicing safe sex. Taking low-dose aspirin every day. Taking vitamin and mineral supplements as recommended by your health care provider. What happens during an annual well check? The services and screenings done by your health care  provider during your annual well check will depend on your age, overall health, lifestyle risk factors, and family history of disease. Counseling  Your health care provider may ask you questions about your: Alcohol use. Tobacco use. Drug use. Emotional well-being. Home and relationship well-being. Sexual activity. Eating habits. History of falls. Memory and ability to understand (cognition). Work and work Astronomer. Reproductive health. Screening  You may have the following tests or measurements: Height, weight, and BMI. Blood pressure. Lipid and cholesterol levels. These may be checked every 5 years, or more frequently if you are over 34 years old. Skin check. Lung cancer screening. You may have this screening every year starting at age 11 if you have a 30-pack-year history of smoking and currently smoke or have quit within the past 15 years. Fecal occult blood test (FOBT) of the stool. You may have this test every year starting at age 65. Flexible sigmoidoscopy or colonoscopy. You may have a sigmoidoscopy every 5 years or a colonoscopy every 10 years starting at age 71. Hepatitis C blood test. Hepatitis B blood test. Sexually transmitted disease (STD) testing. Diabetes screening. This is done by checking your blood sugar (glucose) after you have not eaten for a while (fasting). You may have this done every 1-3 years. Bone density scan. This is done to screen for osteoporosis. You may have this done starting at age 61. Mammogram. This may be done every 1-2 years. Talk  to your health care provider about how often you should have regular mammograms. Talk with your health care provider about your test results, treatment options, and if necessary, the need for more tests. Vaccines  Your health care provider may recommend certain vaccines, such as: Influenza vaccine. This is recommended every year. Tetanus, diphtheria, and acellular pertussis (Tdap, Td) vaccine. You may need a Td  booster every 10 years. Zoster vaccine. You may need this after age 8. Pneumococcal 13-valent conjugate (PCV13) vaccine. One dose is recommended after age 29. Pneumococcal polysaccharide (PPSV23) vaccine. One dose is recommended after age 22. Talk to your health care provider about which screenings and vaccines you need and how often you need them. This information is not intended to replace advice given to you by your health care provider. Make sure you discuss any questions you have with your health care provider. Document Released: 04/08/2015 Document Revised: 11/30/2015 Document Reviewed: 01/11/2015 Elsevier Interactive Patient Education  2017 [employer].  Fall Prevention in the Home Falls can cause injuries. They can happen to people of all ages. There are many things you can do to make your home safe and to help prevent falls. What can I do on the outside of my home? Regularly fix the edges of walkways and driveways and fix any cracks. Remove anything that might make you trip as you walk through a door, such as a raised step or threshold. Trim any bushes or trees on the path to your home. Use bright outdoor lighting. Clear any walking paths of anything that might make someone trip, such as rocks or tools. Regularly check to see if handrails are loose or broken. Make sure that both sides of any steps have handrails. Any raised decks and porches should have guardrails on the edges. Have any leaves, snow, or ice cleared regularly. Use sand or salt on walking paths during winter. Clean up any spills in your garage right away. This includes oil or grease spills. What can I do in the bathroom? Use night lights. Install grab bars by the toilet and in the tub and shower. Do not use towel bars as grab bars. Use non-skid mats or decals in the tub or shower. If you need to sit down in the shower, use a plastic, non-slip stool. Keep the floor dry. Clean up any water that spills on the floor  as soon as it happens. Remove soap buildup in the tub or shower regularly. Attach bath mats securely with double-sided non-slip rug tape. Do not have throw rugs and other things on the floor that can make you trip. What can I do in the bedroom? Use night lights. Make sure that you have a light by your bed that is easy to reach. Do not use any sheets or blankets that are too big for your bed. They should not hang down onto the floor. Have a firm chair that has side arms. You can use this for support while you get dressed. Do not have throw rugs and other things on the floor that can make you trip. What can I do in the kitchen? Clean up any spills right away. Avoid walking on wet floors. Keep items that you use a lot in easy-to-reach places. If you need to reach something above you, use a strong step stool that has a grab bar. Keep electrical cords out of the way. Do not use floor polish or wax that makes floors slippery. If you must use wax, use non-skid floor wax. Do  not have throw rugs and other things on the floor that can make you trip. What can I do with my stairs? Do not leave any items on the stairs. Make sure that there are handrails on both sides of the stairs and use them. Fix handrails that are broken or loose. Make sure that handrails are as long as the stairways. Check any carpeting to make sure that it is firmly attached to the stairs. Fix any carpet that is loose or worn. Avoid having throw rugs at the top or bottom of the stairs. If you do have throw rugs, attach them to the floor with carpet tape. Make sure that you have a light switch at the top of the stairs and the bottom of the stairs. If you do not have them, ask someone to add them for you. What else can I do to help prevent falls? Wear shoes that: Do not have high heels. Have rubber bottoms. Are comfortable and fit you well. Are closed at the toe. Do not wear sandals. If you use a stepladder: Make sure that it is  fully opened. Do not climb a closed stepladder. Make sure that both sides of the stepladder are locked into place. Ask someone to hold it for you, if possible. Clearly mark and make sure that you can see: Any grab bars or handrails. First and last steps. Where the edge of each step is. Use tools that help you move around (mobility aids) if they are needed. These include: Canes. Walkers. Scooters. Crutches. Turn on the lights when you go into a dark area. Replace any light bulbs as soon as they burn out. Set up your furniture so you have a clear path. Avoid moving your furniture around. If any of your floors are uneven, fix them. If there are any pets around you, be aware of where they are. Review your medicines with your doctor. Some medicines can make you feel dizzy. This can increase your chance of falling. Ask your doctor what other things that you can do to help prevent falls. This information is not intended to replace advice given to you by your health care provider. Make sure you discuss any questions you have with your health care provider. Document Released: 01/06/2009 Document Revised: 08/18/2015 Document Reviewed: 04/16/2014 Elsevier Interactive Patient Education  2017 [employer].

## 2022-08-21 NOTE — Progress Notes (Signed)
I connected with  Dierdre Searles on 08/21/22 by a audio enabled telemedicine application and verified that I am speaking with the correct person using two identifiers.  Patient Location: Home  Provider Location: Office/Clinic  I discussed the limitations of evaluation and management by telemedicine. The patient expressed understanding and agreed to proceed.  Subjective:   Amy Montes is a 82 y.o. female who presents for Medicare Annual (Subsequent) preventive examination.  Review of Systems     Cardiac Risk Factors include: advanced age (>23men, >34 women);hypertension;dyslipidemia;sedentary lifestyle     Objective:    There were no vitals filed for this visit. There is no height or weight on file to calculate BMI.     08/21/2022    2:07 PM 04/13/2020    1:16 PM 12/10/2019    8:52 AM 04/08/2019   11:10 AM 09/19/2017    3:24 PM  Advanced Directives  Does Patient Have a Medical Advance Directive? No Yes Yes Yes Yes  Type of Special educational needs teacher of [place];Living will Healthcare Power of [place];Living will Healthcare Power of [place];Living will Living will  Does patient want to make changes to medical advance directive?  No - Patient declined     Copy of Healthcare Power of Attorney in Chart?  No - copy requested No - copy requested No - copy requested   Would patient like information on creating a medical advance directive? No - Patient declined        Current Medications (verified) Outpatient Encounter Medications as of 08/21/2022  Medication Sig   aspirin 81 MG chewable tablet Chew by mouth daily.   levothyroxine (SYNTHROID) 88 MCG tablet Take 1 tablet (88 mcg total) by mouth daily before breakfast.   losartan (COZAAR) 50 MG tablet Take 1 tablet (50 mg total) by mouth daily.   moxifloxacin (VIGAMOX) 0.5 % ophthalmic solution Apply to eye.   prednisoLONE acetate (PRED FORTE) 1 % ophthalmic suspension 1 Drop(s) Left Eye 4 Times Daily   carvedilol (COREG)  12.5 MG tablet Take 1 tablet (12.5 mg total) by mouth 2 (two) times daily with a meal.   rosuvastatin (CRESTOR) 20 MG tablet Take 1 tablet (20 mg total) by mouth daily.   Trospium Chloride 60 MG CP24 Take 1 capsule (60 mg total) by mouth daily. (Patient not taking: Reported on 08/21/2022)   Facility-Administered Encounter Medications as of 08/21/2022  Medication   cephALEXin (KEFLEX) capsule 500 mg    Allergies (verified) Chocolate, Cocoa, Lactose, and Lactose intolerance (gi)   History: Past Medical History:  Diagnosis Date   Asthma    childhood   Asthma    childhood   Chicken pox    Chronic back pain    Said spontaneous thoracic and lumbar spine fractures secondary to falls.   Colon polyps    Coronary artery disease involving native coronary artery without angina pectoris 07/2016   In setting of inferior STEMI, cardiac cath showed occluded RCA.  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  Injured her face. ->  Both aspirin and Plavix were stopped. 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 (no stents)    History  of inferior STEMI 07/31/2016   Culprit lesion was small caliber mRPDA - toosmall for PCI. (no stents)    History of subdural hematoma 07/11/2021   History of vertebral fracture 07/11/2021   Hyperinflation of lungs 07/03/2020   Hyperlipidemia with target LDL less than 70    On rosuvastatin   Hyperlipidemia with target LDL less than 70    On rosuvastatin   Hypothyroidism (acquired)    On Synthroid   Intracranial hemorrhage (HCC)    s/p fall years ago when on ? plavix so this was stopped    Multiple fracture    x 5 vertebrae    Preoperative evaluation to rule out  surgical contraindication 07/11/2021   RUQ abdominal pain 01/30/2019   ST elevation myocardial infarction (STEMI) of inferior wall (HCC) 07/11/2021   Urinary incontinence    UTI (urinary tract infection)    Past Surgical History:  Procedure Laterality Date   ABDOMINAL HYSTERECTOMY     2/2 endometriosis and DUB   BLADDER REPAIR     cataract surgery     b/l   CESAREAN SECTION     COLONOSCOPY WITH PROPOFOL N/A 12/10/2019   Procedure: COLONOSCOPY WITH PROPOFOL;  Surgeon: Wyline Mood, MD;  Location: [facility] ENDOSCOPY;  Service: Gastroenterology;  Laterality: N/A;   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  Culprit lesion mRPDA = was too small to address.  20% pLAD.  LCx-LPL normal.  Patent.  RCA ostial 30% RCA dominant.  Hazy plaque rupture and a small 1.5 mm PDA. --> LV Gram EF 50%, Inf HK. Increased LVEDP   TONSILLECTOMY     TRANSTHORACIC ECHOCARDIOGRAM  11/2015   Right is being within normal limits.   Family History  Problem Relation Age of Onset   Other Mother        Old age   Hearing loss Mother    Cancer Father        colon died in 38   CVA Father    Lupus Sister    Lung cancer Brother    Cancer Brother        lung smoker   Heart disease Maternal Grandmother    Dementia Sister    Colon cancer Brother    Cancer Brother        colon   Cancer Son        testicular cancer dx age 23 now 39 as of 12/2018    Cancer Sister        skin   Breast cancer Neg Hx    Social History   Socioeconomic History   Marital status: Single    Spouse name: Not on file   Number of children: 2   Years of education: Not on file   Highest education level: High school graduate  Occupational History   Occupation: Nanny  Tobacco Use   Smoking status: Never    Passive exposure:  Never   Smokeless tobacco: Never  Vaping Use   Vaping Use: Never used  Substance and Sexual Activity   Alcohol use: Never   Drug use: Never   Sexual activity: Not Currently  Other Topics Concern   Not on file  Social History Narrative   Recently  moved from [place], [place]  Now lives in [place], [place]   Lives alone, but very close to her son Francis Dowse and his wife.   Her daughter lives in [place], [place] 1 biological    Has adopted kids x 2    She has 3 grandchildren.   widow      She drinks coffee.     She does water aerobics/pool exercises.      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  Prior to moving to [place], she was the primary caregiver for her brother who passed away in 08/28/2017.  Once he died, her children convinced her to move up to  to be near them.      6 other siblings 2 1/2 siblings but those 1/2 siblings died in young age   Social Determinants of Health   Financial Resource Strain: Low Risk  (08/21/2022)   Overall Financial Resource Strain (CARDIA)    Difficulty of Paying Living Expenses: Not hard at all  Food Insecurity: No Food Insecurity (08/21/2022)   Hunger Vital Sign    Worried About Running Out of Food in the Last Year: Never true    Ran Out of Food in the Last Year: Never true  Transportation Needs: No Transportation Needs (08/21/2022)   PRAPARE - Administrator, Civil Service (Medical): No    Lack of Transportation (Non-Medical): No  Physical Activity: Insufficiently Active (08/21/2022)   Exercise Vital Sign    Days of Exercise per Week: 2 days    Minutes of Exercise per Session: 20 min  Stress: No Stress Concern Present (08/21/2022)   [employer] of Occupational Health - Occupational Stress Questionnaire    Feeling of Stress : Not at all  Social Connections: Moderately Isolated (08/21/2022)   Social Connection and Isolation Panel [NHANES]    Frequency of Communication with Friends and Family: More than three times a week     Frequency of Social Gatherings with Friends and Family: More than three times a week    Attends Religious Services: More than 4 times per year    Active Member of [employer] or Organizations: No    Attends Banker Meetings: Never    Marital Status: Widowed    Tobacco Counseling Counseling given: Not Answered   Clinical Intake:  Pre-visit preparation completed: Yes  Pain : No/denies pain     Nutritional Risks: None Diabetes: No  How often do you need to have someone help you when you read instructions, pamphlets, or other written materials from your doctor or pharmacy?: 1 - Never  Diabetic?no  Interpreter Needed?: No  Information entered by :: Kennedy Bucker, LPN   Activities of Daily Living    08/21/2022    2:09 PM  In your present state of health, do you have any difficulty performing the following activities:  Hearing? 0  Vision? 0  Difficulty concentrating or making decisions? 0  Walking or climbing stairs? 0  Dressing or bathing? 0  Doing errands, shopping? 0  Preparing Food and eating ? N  Using the Toilet? N  In the past six months, have you accidently leaked urine? N  Do you have problems with loss of bowel control? N  Managing your Medications? N  Managing your Finances? N  Housekeeping or managing your Housekeeping? N    Patient Care Team: Dana Allan, MD as PCP - General (Family Medicine) Marykay Lex, MD as PCP - Cardiology (Cardiology)  Indicate any recent  Medical Services you may have received from other than [facility] providers in the past year (date may be approximate).     Assessment:   This is a routine wellness examination for Elizabelle.  Hearing/Vision screen Hearing Screening - Comments:: No aids Vision Screening - Comments:: Readers- [facility]- Dr.Houser in [place]  Dietary issues and exercise activities discussed: Current Exercise Habits: Home exercise routine, Type of exercise: walking, Time (Minutes): 20, Frequency  (Times/Week): 2, Weekly Exercise (Minutes/Week): 40, Intensity: Mild   Goals Addressed             This Visit's Progress    DIET - EAT MORE FRUITS AND VEGETABLES         Depression Screen    08/21/2022    2:05 PM 02/22/2022    9:07 AM 08/22/2021    8:59 AM 04/14/2021    2:53 PM 04/13/2020    1:16 PM 11/06/2019    9:01 AM 05/07/2019   10:42 AM  PHQ 2/9 Scores  PHQ - 2 Score 0 0 0 0 0 0 0  PHQ- 9 Score 0          Fall Risk    08/21/2022    2:09 PM 02/22/2022    9:05 AM 08/22/2021    8:59 AM 04/14/2021    2:54 PM 04/13/2020    1:15 PM  Fall Risk   Falls in the past year? 0 0 1 1 0  Number falls in past yr: 0 0 1 0 0  Injury with Fall? 0 0 0 0 0  Risk for fall due to : No Fall Risks No Fall Risks History of fall(s)    Follow up Falls prevention discussed;Falls evaluation completed Falls evaluation completed Falls evaluation completed Falls evaluation completed Falls evaluation completed    FALL RISK PREVENTION PERTAINING TO THE HOME:  Any stairs in or around the home? No  If so, are there any without handrails? No  Home free of loose throw rugs in walkways, pet beds, electrical cords, etc? Yes  Adequate lighting in your home to reduce risk of falls? Yes   ASSISTIVE DEVICES UTILIZED TO PREVENT FALLS:  Life alert? No  Use of a cane, walker or w/c? No - cane very occasionally Grab bars in the bathroom? Yes  Shower chair or bench in shower? No  Elevated toilet seat or a handicapped toilet? Yes   Cognitive Function:        08/21/2022    2:14 PM 04/14/2021    3:07 PM 04/13/2020    1:25 PM 04/08/2019   11:21 AM  6CIT Screen  What Year? 0 points 0 points 0 points 0 points  What month? 0 points 0 points 0 points 0 points  What time? 0 points 0 points 0 points 0 points  Count back from 20 0 points 0 points 0 points 0 points  Months in reverse 0 points 0 points 0 points 0 points  Repeat phrase 0 points 0 points 0 points 0 points  Total Score 0 points 0 points 0 points 0  points    Immunizations Immunization History  Administered Date(s) Administered   Janssen (J&J) SARS-COV-2 Vaccination 08/21/2019   Moderna SARS-COV2 Booster Vaccination 03/01/2020    TDAP status: Due, Education has been provided regarding the importance of this vaccine. Advised may receive this vaccine at local pharmacy or Health Dept. Aware to provide a copy of the vaccination record if obtained from local pharmacy or Health Dept. Verbalized acceptance and understanding.  Flu  Vaccine status: Declined, Education has been provided regarding the importance of this vaccine but patient still declined. Advised may receive this vaccine at local pharmacy or Health Dept. Aware to provide a copy of the vaccination record if obtained from local pharmacy or Health Dept. Verbalized acceptance and understanding.  Pneumococcal vaccine status: Declined,  Education has been provided regarding the importance of this vaccine but patient still declined. Advised may receive this vaccine at local pharmacy or Health Dept. Aware to provide a copy of the vaccination record if obtained from local pharmacy or Health Dept. Verbalized acceptance and understanding.   Covid-19 vaccine status: Completed vaccines  Qualifies for Shingles Vaccine? Yes   Zostavax completed No   Shingrix Completed?: No.    Education has been provided regarding the importance of this vaccine. Patient has been advised to call insurance company to determine out of pocket expense if they have not yet received this vaccine. Advised may also receive vaccine at local pharmacy or Health Dept. Verbalized acceptance and understanding.  Screening Tests Health Maintenance  Topic Date Due   Zoster Vaccines- Shingrix (1 of 2) Never done   COVID-19 Vaccine (2 - Janssen risk series) 03/29/2020   Pneumonia Vaccine 102+ Years old (1 of 1 - PCV) 02/23/2023 (Originally 07/14/2007)   INFLUENZA VACCINE  10/25/2022   Medicare Annual Wellness (AWV)  08/21/2023    DEXA SCAN  Completed   HPV VACCINES  Aged Out   DTaP/Tdap/Td  Discontinued   Hepatitis C Screening  Discontinued    Health Maintenance  Health Maintenance Due  Topic Date Due   Zoster Vaccines- Shingrix (1 of 2) Never done   COVID-19 Vaccine (2 - Janssen risk series) 03/29/2020    Colorectal cancer screening: No longer required.   Mammogram status: No longer required due to age.  Bone Density status: Completed 09/20/21. Results reflect: Bone density results: OSTEOPENIA. Repeat every 5 years.  Lung Cancer Screening: (Low Dose CT Chest recommended if Age 65-80 years, 30 pack-year currently smoking OR have quit w/in 15years.) does not qualify.    Additional Screening:  Hepatitis C Screening: does qualify; Completed 06/16/20  Vision Screening: Recommended annual ophthalmology exams for early detection of glaucoma and other disorders of the eye. Is the patient up to date with their annual eye exam?  Yes  Who is the provider or what is the name of the office in which the patient attends annual eye exams? [facility] Dr.Houser If pt is not established with a provider, would they like to be referred to a provider to establish care? No .   Dental Screening: Recommended annual dental exams for proper oral hygiene  Community Resource Referral / Chronic Care Management: CRR required this visit?  No   CCM required this visit?  No      Plan:     I have personally reviewed and noted the following in the patient's chart:   Medical and social history Use of alcohol, tobacco or illicit drugs  Current medications and supplements including opioid prescriptions. Patient is not currently taking opioid prescriptions. Functional ability and status Nutritional status Physical activity Advanced directives List of other physicians Hospitalizations, surgeries, and ER visits in previous 12 months Vitals Screenings to include cognitive, depression, and falls Referrals and  appointments  In addition, I have reviewed and discussed with patient certain preventive protocols, quality metrics, and best practice recommendations. A written personalized care plan for preventive services as well as general preventive health recommendations were provided to patient.  Hal Hope, LPN   7/82/9562   Nurse Notes: none

## 2022-08-23 ENCOUNTER — Other Ambulatory Visit: Payer: Self-pay | Admitting: Family Medicine

## 2022-08-23 DIAGNOSIS — Z1231 Encounter for screening mammogram for malignant neoplasm of breast: Secondary | ICD-10-CM

## 2022-08-27 ENCOUNTER — Ambulatory Visit (INDEPENDENT_AMBULATORY_CARE_PROVIDER_SITE_OTHER): Payer: Medicare Other | Admitting: *Deleted

## 2022-08-27 DIAGNOSIS — M8000XD Age-related osteoporosis with current pathological fracture, unspecified site, subsequent encounter for fracture with routine healing: Secondary | ICD-10-CM

## 2022-08-27 MED ORDER — DENOSUMAB 60 MG/ML ~~LOC~~ SOSY
60.0000 mg | PREFILLED_SYRINGE | Freq: Once | SUBCUTANEOUS | Status: AC
  Administered 2022-08-27: 60 mg via SUBCUTANEOUS

## 2022-08-27 NOTE — Progress Notes (Signed)
Patient received Prolia injection in right arm (subcu). Tolerated it well with no complaints or concerns.

## 2022-09-03 NOTE — Progress Notes (Signed)
SUBJECTIVE:   Chief Complaint  Patient presents with   Annual Exam   HPI Patient presents to for annual physical  No acute concerns today  Osteoporosis Doing well.  No recent falls.  Recent Prolia injection 6/03.    Hypertension Asymptomatic.  Currently taking carvedilol 12.5 mg twice daily, losartan 25 mg  daily and tolerating medications well.  Blood pressures at home range 110-130/48-70s.  Denies any headaches, dizziness or weakness, chest pain, shortness of breath or lower extremity edema. Would like to come off Losartan if possible. Requesting refills for medications.  Hyperlipidemia Requesting  Crestor 20 mg daily.  Denies any myalgias or joint pain.  Hypothyroidism Asymptomatic.  Requesting refill of levothyroxine 88 mcg daily.   Review of Systems - General ROS: negative   PERTINENT PMH / PSH: Osteopenia Hypertension CAD Asthma GERD Hyperlipidemia Hypothyroidism  OBJECTIVE:  BP 130/76   Pulse 71   Temp 97.6 F (36.4 C)   Ht 5' (1.524 m)   Wt 142 lb (64.4 kg)   SpO2 96%   BMI 27.73 kg/m    Physical Exam Vitals reviewed.  Constitutional:      General: She is not in acute distress.    Appearance: Normal appearance. She is not ill-appearing.  HENT:     Head: Normocephalic.  Eyes:     Conjunctiva/sclera: Conjunctivae normal.  Neck:     Thyroid: No thyromegaly or thyroid tenderness.  Cardiovascular:     Rate and Rhythm: Normal rate and regular rhythm.     Pulses: Normal pulses.  Pulmonary:     Effort: Pulmonary effort is normal.     Breath sounds: Normal breath sounds.  Abdominal:     General: Bowel sounds are normal.  Neurological:     Mental Status: She is alert. Mental status is at baseline.  Psychiatric:        Mood and Affect: Mood normal.        Behavior: Behavior normal.        Thought Content: Thought content normal.        Judgment: Judgment normal.       09/04/2022    8:07 AM 08/21/2022    2:05 PM 02/22/2022    9:07 AM  08/22/2021    8:59 AM 04/14/2021    2:53 PM  Depression screen PHQ 2/9  Decreased Interest 0 0 0 0 0  Down, Depressed, Hopeless 0 0 0 0 0  PHQ - 2 Score 0 0 0 0 0  Altered sleeping 0 0     Tired, decreased energy 0 0     Change in appetite 0 0     Feeling bad or failure about yourself  0 0     Trouble concentrating 0 0     Moving slowly or fidgety/restless 0 0     Suicidal thoughts 0 0     PHQ-9 Score 0 0     Difficult doing work/chores Not difficult at all Not difficult at all          09/04/2022    8:07 AM 11/06/2019    9:01 AM  GAD 7 : Generalized Anxiety Score  Nervous, Anxious, on Edge 0 0  Control/stop worrying 0 0  Worry too much - different things 0 0  Trouble relaxing 0 0  Restless 0 0  Easily annoyed or irritable 0 0  Afraid - awful might happen 0 0  Total GAD 7 Score 0 0  Anxiety Difficulty Not difficult at all Not  difficult at all     ASSESSMENT/PLAN:  Annual physical exam Assessment & Plan: HCM Recommend shingles vaccine Recommend pneumonia 20 vaccine Recommend tetanus booster Mammogram scheduled 07/24 DEXA scan completed Medicare annual wellness completed No indication for continued cervical cancer screening, age greater than 4 No indication for continued colon cancer screening age greater than 76, no history of polyps or family history Self breast exams regularly Depression/GAD screening completed   Hypothyroidism, unspecified type Assessment & Plan: Chronic.  Asymptomatic. Continue Synthroid 88 mcg daily Check TSH.  If continues to trend downward can consider decreasing Levothyroxine to 75 mcg daily and repeat levels in 6-8 weeks   Orders: -     TSH -     Levothyroxine Sodium; Take 1 tablet (88 mcg total) by mouth daily before breakfast.  Dispense: 90 tablet; Refill: 3  Essential hypertension Assessment & Plan: Chronic.  Well-controlled on current antihypertensives.  Goal less than 140/90 per JNC 8 guidelines.  Had previously decreased Cozaar  from 50 mg twice daily to 25 mg twice daily for soft blood pressures.  Patient has now been taking 25 mg nightly.  Blood pressure has been well-controlled with decrease in medication. Continue Cozaar to 25 mg at night Continue carvedilol 12.5 mg twice daily Monitor blood pressure at home if returns back to greater than 130/80 can resume Cozaar 25 mg twice daily Strict return precautions provided.  Orders: -     CBC with Differential/Platelet -     Comprehensive metabolic panel -     Carvedilol; Take 1 tablet (12.5 mg total) by mouth 2 (two) times daily with a meal.  Dispense: 180 tablet; Refill: 3 -     Losartan Potassium; Take 2 tablets (50 mg total) by mouth at bedtime.  Hyperlipidemia, unspecified hyperlipidemia type Assessment & Plan: Chronic.  On statin therapy and tolerating well. Refill rosuvastatin 20 mg daily Check fasting lipids  Orders: -     Lipid panel -     Rosuvastatin Calcium; Take 1 tablet (20 mg total) by mouth daily.  Dispense: 90 tablet; Refill: 3  Abnormal glucose -     Hemoglobin A1c  Stage 3a chronic kidney disease (HCC) Assessment & Plan: Chronic Check KFTs Avoid Nephrotoxic agents BP well controlled, goal less than 140/90 per JNC 8 guidelines       PDMP reviewed  Return in about 6 months (around 03/06/2023) for PCP.  Dana Allan, MD

## 2022-09-03 NOTE — Patient Instructions (Incomplete)
It was a pleasure meeting you today. Thank you for allowing me to take part in your health care.  Our goals for today as we discussed include:  We will get some labs today.  If they are abnormal or we need to do something about them, I will call you.  If they are normal, I will send you a message on MyChart (if it is active) or a letter in the mail.  If you don't hear from [place] in 2 weeks, please call the office at the number below.   Recommend Shingles vaccine.  This is a 2 dose series and can be given at your local pharmacy.  Please talk to your pharmacist about this.   Recommend Pneumonia 20 vaccine  If you have any questions or concerns, please do not hesitate to call the office at [phone].  I look forward to our next visit and until then take care and stay safe.  Regards,   Dana Allan, MD   [facility]

## 2022-09-04 ENCOUNTER — Encounter: Payer: Self-pay | Admitting: Family Medicine

## 2022-09-04 ENCOUNTER — Ambulatory Visit (INDEPENDENT_AMBULATORY_CARE_PROVIDER_SITE_OTHER): Payer: Medicare Other | Admitting: Family Medicine

## 2022-09-04 VITALS — BP 130/76 | HR 71 | Temp 97.6°F | Ht 60.0 in | Wt 142.0 lb

## 2022-09-04 DIAGNOSIS — E785 Hyperlipidemia, unspecified: Secondary | ICD-10-CM | POA: Diagnosis not present

## 2022-09-04 DIAGNOSIS — E039 Hypothyroidism, unspecified: Secondary | ICD-10-CM | POA: Diagnosis not present

## 2022-09-04 DIAGNOSIS — N1831 Chronic kidney disease, stage 3a: Secondary | ICD-10-CM

## 2022-09-04 DIAGNOSIS — Z0000 Encounter for general adult medical examination without abnormal findings: Secondary | ICD-10-CM | POA: Diagnosis not present

## 2022-09-04 DIAGNOSIS — R7309 Other abnormal glucose: Secondary | ICD-10-CM

## 2022-09-04 DIAGNOSIS — I10 Essential (primary) hypertension: Secondary | ICD-10-CM

## 2022-09-04 LAB — TSH: TSH: 0.97 u[IU]/mL (ref 0.35–5.50)

## 2022-09-04 LAB — LIPID PANEL
Cholesterol: 119 mg/dL (ref 0–200)
HDL: 59 mg/dL (ref >39.00)
LDL Cholesterol: 45 mg/dL (ref 0–99)
NonHDL: 59.91
Total CHOL/HDL Ratio: 2
Triglycerides: 74 mg/dL (ref 0.0–149.0)
VLDL: 14.8 mg/dL (ref 0.0–40.0)

## 2022-09-04 LAB — CBC WITH DIFFERENTIAL/PLATELET
Basophils Absolute: 0.1 10*3/uL (ref 0.0–0.1)
Basophils Relative: 0.9 % (ref 0.0–3.0)
Eosinophils Absolute: 0.2 10*3/uL (ref 0.0–0.7)
Eosinophils Relative: 3.5 % (ref 0.0–5.0)
HCT: 40.7 % (ref 36.0–46.0)
Hemoglobin: 13.1 g/dL (ref 12.0–15.0)
Lymphocytes Relative: 24 % (ref 12.0–46.0)
Lymphs Abs: 1.6 10*3/uL (ref 0.7–4.0)
MCHC: 32.2 g/dL (ref 30.0–36.0)
MCV: 97.8 fl (ref 78.0–100.0)
Monocytes Absolute: 0.6 10*3/uL (ref 0.1–1.0)
Monocytes Relative: 9.5 % (ref 3.0–12.0)
Neutro Abs: 4.2 10*3/uL (ref 1.4–7.7)
Neutrophils Relative %: 62.1 % (ref 43.0–77.0)
Platelets: 237 10*3/uL (ref 150.0–400.0)
RBC: 4.17 Mil/uL (ref 3.87–5.11)
RDW: 13.6 % (ref 11.5–15.5)
WBC: 6.7 10*3/uL (ref 4.0–10.5)

## 2022-09-04 LAB — COMPREHENSIVE METABOLIC PANEL
ALT: 12 U/L (ref 0–35)
AST: 20 U/L (ref 0–37)
Albumin: 4 g/dL (ref 3.5–5.2)
Alkaline Phosphatase: 62 U/L (ref 39–117)
BUN: 15 mg/dL (ref 6–23)
CO2: 27 mEq/L (ref 19–32)
Calcium: 8.9 mg/dL (ref 8.4–10.5)
Chloride: 102 mEq/L (ref 96–112)
Creatinine, Ser: 0.83 mg/dL (ref 0.40–1.20)
GFR: 66.71 mL/min (ref >60.00)
Glucose, Bld: 103 mg/dL — ABNORMAL HIGH (ref 70–99)
Potassium: 4.5 mEq/L (ref 3.5–5.1)
Sodium: 134 mEq/L — ABNORMAL LOW (ref 135–145)
Total Bilirubin: 0.4 mg/dL (ref 0.2–1.2)
Total Protein: 7.6 g/dL (ref 6.0–8.3)

## 2022-09-04 LAB — HEMOGLOBIN A1C: Hgb A1c MFr Bld: 6.1 % (ref 4.6–6.5)

## 2022-09-04 MED ORDER — CARVEDILOL 12.5 MG PO TABS: 12.5000 mg | ORAL_TABLET | Freq: Two times a day (BID) | ORAL | 3 refills | Status: DC

## 2022-09-04 MED ORDER — LEVOTHYROXINE SODIUM 88 MCG PO TABS: 88.0000 ug | ORAL_TABLET | Freq: Every day | ORAL | 3 refills | Status: DC

## 2022-09-04 MED ORDER — ROSUVASTATIN CALCIUM 20 MG PO TABS: 20.0000 mg | ORAL_TABLET | Freq: Every day | ORAL | 3 refills | Status: DC

## 2022-09-06 ENCOUNTER — Encounter: Payer: Self-pay | Admitting: *Deleted

## 2022-09-09 ENCOUNTER — Encounter: Payer: Self-pay | Admitting: Family Medicine

## 2022-09-09 DIAGNOSIS — R7309 Other abnormal glucose: Secondary | ICD-10-CM | POA: Insufficient documentation

## 2022-09-09 DIAGNOSIS — N1831 Chronic kidney disease, stage 3a: Secondary | ICD-10-CM | POA: Insufficient documentation

## 2022-09-09 DIAGNOSIS — Z0000 Encounter for general adult medical examination without abnormal findings: Secondary | ICD-10-CM | POA: Insufficient documentation

## 2022-09-09 MED ORDER — LOSARTAN POTASSIUM 25 MG PO TABS: 50.0000 mg | ORAL_TABLET | Freq: Every evening | ORAL | Status: DC

## 2022-09-09 NOTE — Assessment & Plan Note (Signed)
HCM Recommend shingles vaccine Recommend pneumonia 20 vaccine Recommend tetanus booster Mammogram scheduled 07/24 DEXA scan completed Medicare annual wellness completed No indication for continued cervical cancer screening, age greater than 82 No indication for continued colon cancer screening age greater than 41, no history of polyps or family history Self breast exams regularly Depression/GAD screening completed

## 2022-09-09 NOTE — Assessment & Plan Note (Signed)
Chronic.  Asymptomatic. Continue Synthroid 88 mcg daily Check TSH.  If continues to trend downward can consider decreasing Levothyroxine to 75 mcg daily and repeat levels in 6-8 weeks

## 2022-09-09 NOTE — Assessment & Plan Note (Addendum)
Chronic.  Well-controlled on current antihypertensives.  Goal less than 140/90 per JNC 8 guidelines.  Had previously decreased Cozaar from 50 mg twice daily to 25 mg twice daily for soft blood pressures.  Patient has now been taking 25 mg nightly.  Blood pressure has been well-controlled with decrease in medication. Continue Cozaar to 25 mg at night Continue carvedilol 12.5 mg twice daily Monitor blood pressure at home if returns back to greater than 130/80 can resume Cozaar 25 mg twice daily Strict return precautions provided.

## 2022-09-09 NOTE — Assessment & Plan Note (Signed)
Chronic Check KFTs Avoid Nephrotoxic agents BP well controlled, goal less than 140/90 per JNC 8 guidelines

## 2022-09-09 NOTE — Assessment & Plan Note (Signed)
Chronic.  On statin therapy and tolerating well. Refill rosuvastatin 20 mg daily Check fasting lipids

## 2022-10-11 ENCOUNTER — Ambulatory Visit
Admission: RE | Admit: 2022-10-11 | Discharge: 2022-10-11 | Disposition: A | Discharge status: Home / Self Care | Payer: Medicare Other | Source: Ambulatory Visit | Attending: Family Medicine | Admitting: Family Medicine

## 2022-10-11 DIAGNOSIS — Z1231 Encounter for screening mammogram for malignant neoplasm of breast: Secondary | ICD-10-CM | POA: Insufficient documentation

## 2023-02-20 ENCOUNTER — Encounter: Payer: Self-pay | Admitting: *Deleted

## 2023-02-20 MED ORDER — DENOSUMAB 60 MG/ML ~~LOC~~ SOSY
60.0000 mg | PREFILLED_SYRINGE | Freq: Once | SUBCUTANEOUS | Status: AC
  Administered 2023-03-15: 60 mg via SUBCUTANEOUS

## 2023-02-20 NOTE — Addendum Note (Signed)
Addended by: Warden Fillers on: 02/20/2023 10:19 AM   Modules accepted: Orders

## 2023-03-06 ENCOUNTER — Ambulatory Visit: Payer: TRICARE For Life (TFL) | Admitting: Family Medicine

## 2023-03-15 ENCOUNTER — Encounter: Payer: Self-pay | Admitting: Family Medicine

## 2023-03-15 ENCOUNTER — Ambulatory Visit (INDEPENDENT_AMBULATORY_CARE_PROVIDER_SITE_OTHER): Payer: Medicare Other | Admitting: Family Medicine

## 2023-03-15 VITALS — BP 140/82 | HR 71 | Temp 97.6°F | Resp 18 | Ht 60.0 in | Wt 142.0 lb

## 2023-03-15 DIAGNOSIS — M810 Age-related osteoporosis without current pathological fracture: Secondary | ICD-10-CM | POA: Diagnosis not present

## 2023-03-15 DIAGNOSIS — I10 Essential (primary) hypertension: Secondary | ICD-10-CM

## 2023-03-15 DIAGNOSIS — E039 Hypothyroidism, unspecified: Secondary | ICD-10-CM

## 2023-03-15 DIAGNOSIS — M8000XD Age-related osteoporosis with current pathological fracture, unspecified site, subsequent encounter for fracture with routine healing: Secondary | ICD-10-CM

## 2023-03-15 DIAGNOSIS — E785 Hyperlipidemia, unspecified: Secondary | ICD-10-CM

## 2023-03-15 MED ORDER — DENOSUMAB 60 MG/ML ~~LOC~~ SOSY: 60.0000 mg | PREFILLED_SYRINGE | Freq: Once | SUBCUTANEOUS | Status: DC

## 2023-03-15 MED ORDER — ROSUVASTATIN CALCIUM 10 MG PO TABS: 10.0000 mg | ORAL_TABLET | Freq: Every day | ORAL | Status: DC

## 2023-03-15 NOTE — Progress Notes (Signed)
Pt presented for their subcutaneous Prolia injection. Pt was identified through two identifiers. Pt was given the information packets about the Prolia and told to schedule their next injection 6 months out. Pt tolerated the subq injection well in the left arm.  

## 2023-03-15 NOTE — Patient Instructions (Addendum)
It was a pleasure meeting you today. Thank you for allowing me to take part in your health care.  Our goals for today as we discussed include:  Prolia injection today Next injection in 6 months  Blood pressure improved at second check Monitor at home.  Goal is less than 150/90  Continue Rosuvastatin 20 mg daily at 6-7 pm.  Once you are getting low on this prescription call clinic for refill.  Your next prescription will be for Rosuvastatin 10 mg daily in the evening.  Follow up in 3 months   This is a list of the screening recommended for you and due dates:  Health Maintenance  Topic Date Due   COVID-19 Vaccine (2 - Janssen risk series) 03/29/2020   Zoster (Shingles) Vaccine (1 of 2) 06/13/2023*   Flu Shot  06/24/2023*   Pneumonia Vaccine (1 of 2 - PCV) 03/14/2024*   Medicare Annual Wellness Visit  08/21/2023   DEXA scan (bone density measurement)  Completed   HPV Vaccine  Aged Out   DTaP/Tdap/Td vaccine  Discontinued   Hepatitis C Screening  Discontinued  *Topic was postponed. The date shown is not the original due date.      If you have any questions or concerns, please do not hesitate to call the office at [phone].  I look forward to our next visit and until then take care and stay safe.  Regards,   Dana Allan, MD   [facility]

## 2023-03-15 NOTE — Progress Notes (Unsigned)
SUBJECTIVE:   Chief Complaint  Patient presents with   Osteoporosis   HPI Presents to clinic for follow up chronic disease management  Discussed the use of AI scribe software for clinical note transcription with the patient, who gave verbal consent to proceed.  History of Present Illness The patient, with a history of hypertension, hyperlipidemia, and hypothyroidism, presents for a routine follow-up. They report that their blood pressure readings at home have been consistently below 135, with a recent reading of 117. However, they note that their blood pressure readings tend to be higher during clinic visits, which they attribute to the stress of travel.  The patient also reports increased urinary frequency, particularly at night, which they believe may be related to their evening dose of rosuvastatin. They express a desire to reduce their nighttime urination and inquire about the possibility of adjusting the timing of their rosuvastatin dose.  The patient has been managing their hypothyroidism with levothyroxine, taken in the morning on an empty stomach. They have been adhering to this regimen as instructed.  Regarding their hyperlipidemia, the patient has been on rosuvastatin for several years. They express a willingness to consider a dose reduction, pending review of their most recent cholesterol levels.  The patient also mentions a past mild heart attack, approximately eight years ago, which was not definitively diagnosed but led to the initiation of several medications. They have not experienced any chest pain or other cardiac symptoms recently.  The patient also notes some heaviness in their legs, but denies any significant swelling or pain. They have switched to wearing diabetes socks for comfort due to the heaviness.  Overall, the patient appears to be managing their chronic conditions well, with no new or worsening symptoms reported. They are open to medication adjustments to  improve their quality of life, particularly regarding their nocturia.    PERTINENT PMH / PSH: As above  OBJECTIVE:  BP (!) 140/82   Pulse 71   Temp 97.6 F (36.4 C) (Oral)   Resp 18   Ht 5' (1.524 m)   Wt 142 lb (64.4 kg)   SpO2 97%   BMI 27.73 kg/m    Physical Exam Vitals reviewed.  Constitutional:      General: She is not in acute distress.    Appearance: Normal appearance. She is normal weight. She is not ill-appearing, toxic-appearing or diaphoretic.  Eyes:     General:        Right eye: No discharge.        Left eye: No discharge.     Conjunctiva/sclera: Conjunctivae normal.  Cardiovascular:     Rate and Rhythm: Normal rate and regular rhythm.     Heart sounds: Normal heart sounds.  Pulmonary:     Effort: Pulmonary effort is normal.     Breath sounds: Normal breath sounds.  Abdominal:     General: Bowel sounds are normal.  Musculoskeletal:        General: Normal range of motion.  Skin:    General: Skin is warm and dry.  Neurological:     General: No focal deficit present.     Mental Status: She is alert and oriented to person, place, and time. Mental status is at baseline.  Psychiatric:        Mood and Affect: Mood normal.        Behavior: Behavior normal.        Thought Content: Thought content normal.        Judgment: Judgment normal.  03/15/2023   11:29 AM 09/04/2022    8:07 AM 08/21/2022    2:05 PM 02/22/2022    9:07 AM 08/22/2021    8:59 AM  Depression screen PHQ 2/9  Decreased Interest 0 0 0 0 0  Down, Depressed, Hopeless 0 0 0 0 0  PHQ - 2 Score 0 0 0 0 0  Altered sleeping 0 0 0    Tired, decreased energy 0 0 0    Change in appetite 0 0 0    Feeling bad or failure about yourself  0 0 0    Trouble concentrating 0 0 0    Moving slowly or fidgety/restless 0 0 0    Suicidal thoughts 0 0 0    PHQ-9 Score 0 0 0    Difficult doing work/chores Not difficult at all Not difficult at all Not difficult at all        03/15/2023   11:29 AM  09/04/2022    8:07 AM 11/06/2019    9:01 AM  GAD 7 : Generalized Anxiety Score  Nervous, Anxious, on Edge 0 0 0  Control/stop worrying 0 0 0  Worry too much - different things 0 0 0  Trouble relaxing 0 0 0  Restless 0 0 0  Easily annoyed or irritable 0 0 0  Afraid - awful might happen 0 0 0  Total GAD 7 Score 0 0 0  Anxiety Difficulty Not difficult at all Not difficult at all Not difficult at all    ASSESSMENT/PLAN:  Osteoporosis with current pathological fracture with routine healing, unspecified osteoporosis type, subsequent encounter Assessment & Plan: Patient due for Prolia injection today. -Administer Prolia injection today.  Orders: -     Denosumab  Hyperlipidemia, unspecified hyperlipidemia type Assessment & Plan: Patient on rosuvastatin 20mg  with good tolerance. Patient reports nocturia, possibly related to medication timing. -Decrease rosuvastatin to 10mg  daily. -Advise patient to take rosuvastatin in the evening (around supper time) instead of at bedtime to potentially reduce nocturia. -Check Lipids at next visit  Orders: -     Rosuvastatin Calcium; Take 1 tablet (10 mg total) by mouth daily.  Essential hypertension Assessment & Plan: Elevated blood pressure in clinic, but patient reports normal readings at home. No symptoms of hypertensive urgency/emergency. Previously on losartan, but discontinued due to good control. -Recheck blood pressure before patient leaves clinic today. -Advise patient to continue home blood pressure monitoring and document readings. -Schedule follow-up in 3 months to reassess blood pressure.   Hypothyroidism, unspecified type Assessment & Plan: Patient on levothyroxine, taking correctly on an empty stomach. -Continue current regimen.    PDMP reviewed  Return in about 3 months (around 06/13/2023) for PCP, HTN.  Dana Allan, MD

## 2023-03-20 ENCOUNTER — Encounter: Payer: Self-pay | Admitting: Family Medicine

## 2023-03-20 NOTE — Assessment & Plan Note (Signed)
Elevated blood pressure in clinic, but patient reports normal readings at home. No symptoms of hypertensive urgency/emergency. Previously on losartan, but discontinued due to good control. -Recheck blood pressure before patient leaves clinic today. -Advise patient to continue home blood pressure monitoring and document readings. -Schedule follow-up in 3 months to reassess blood pressure.

## 2023-03-20 NOTE — Assessment & Plan Note (Signed)
Patient due for Prolia injection today. -Administer Prolia injection today.

## 2023-03-20 NOTE — Assessment & Plan Note (Signed)
Patient on levothyroxine, taking correctly on an empty stomach. -Continue current regimen.

## 2023-03-20 NOTE — Assessment & Plan Note (Addendum)
Patient on rosuvastatin 20mg  with good tolerance. Patient reports nocturia, possibly related to medication timing. -Decrease rosuvastatin to 10mg  daily. -Advise patient to take rosuvastatin in the evening (around supper time) instead of at bedtime to potentially reduce nocturia. -Check Lipids at next visit

## 2023-03-21 ENCOUNTER — Encounter: Payer: Self-pay | Admitting: Nurse Practitioner

## 2023-03-21 ENCOUNTER — Ambulatory Visit
Admission: RE | Admit: 2023-03-21 | Discharge: 2023-03-21 | Disposition: A | Discharge status: Home / Self Care | Payer: TRICARE For Life (TFL) | Attending: Nurse Practitioner | Admitting: Nurse Practitioner

## 2023-03-21 ENCOUNTER — Ambulatory Visit (INDEPENDENT_AMBULATORY_CARE_PROVIDER_SITE_OTHER): Payer: Medicare Other | Admitting: Nurse Practitioner

## 2023-03-21 ENCOUNTER — Other Ambulatory Visit: Payer: Self-pay | Admitting: Nurse Practitioner

## 2023-03-21 ENCOUNTER — Ambulatory Visit
Admission: RE | Admit: 2023-03-21 | Discharge: 2023-03-21 | Disposition: A | Discharge status: Home / Self Care | Payer: TRICARE For Life (TFL) | Source: Ambulatory Visit | Attending: Nurse Practitioner | Admitting: Nurse Practitioner

## 2023-03-21 VITALS — BP 116/76 | HR 65 | Temp 97.7°F | Ht 60.0 in | Wt 142.0 lb

## 2023-03-21 DIAGNOSIS — M25552 Pain in left hip: Secondary | ICD-10-CM

## 2023-03-21 DIAGNOSIS — M25551 Pain in right hip: Secondary | ICD-10-CM

## 2023-03-21 MED ORDER — MELOXICAM 7.5 MG PO TABS: 7.5000 mg | ORAL_TABLET | Freq: Every day | ORAL | 0 refills | Status: DC

## 2023-03-21 NOTE — Patient Instructions (Signed)
Please go to the [facility] outpatient imaging for X-rays [facility] at [facility] [address] [place], [place] [address] [phone]

## 2023-03-21 NOTE — Progress Notes (Signed)
Established Patient Office Visit  Subjective:  Patient ID: Amy Montes, female    DOB: 06/29/1940  Age: 82 y.o. MRN: [id number]  CC:  Chief Complaint  Patient presents with   Acute Visit    Bilateral hip pain Pain 9/10    HPI  Amy Montes presents for bilateral hip pain. Pt states that she had Prolia injection on 03/15/2023 and has noticed bilateral hip pain 2 days later.  Patient reports inability to perform routine activities that she previously managed with ease.  She is now using a cane to ambulate. Hip Pain  The incident occurred 3 to 5 days ago. There was no injury mechanism. The pain is present in the left hip and right hip. The quality of the pain is described as aching and shooting. The pain is at a severity of 9/10. The pain is severe. The pain has been Constant since onset. Associated symptoms include an inability to bear weight. Pertinent negatives include no loss of motion, loss of sensation, numbness or tingling. She reports no foreign bodies present. Nothing aggravates the symptoms. She has tried heat and acetaminophen for the symptoms. The treatment provided mild relief.     Past Medical History:  Diagnosis Date   Asthma    childhood   Asthma    childhood   Chicken pox    Chronic back pain    Said spontaneous thoracic and lumbar spine fractures secondary to falls.   Colon polyps    Coronary artery disease involving native coronary artery without angina pectoris 07/2016   In setting of inferior STEMI, cardiac cath showed occluded RCA.  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  Injured her face. ->  Both aspirin and Plavix were stopped. 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 (no stents)    History of subdural hematoma 07/11/2021   History of vertebral fracture 07/11/2021   Hyperinflation of lungs 07/03/2020   Hyperlipidemia with target LDL less than 70    On rosuvastatin   Hyperlipidemia with target LDL less than 70    On rosuvastatin   Hypothyroidism (acquired)    On Synthroid   Intracranial hemorrhage (HCC)    s/p fall years ago when on ? plavix so this was stopped    Multiple fracture    x 5 vertebrae    Preoperative evaluation to rule out surgical contraindication 07/11/2021   RUQ abdominal pain 01/30/2019   ST elevation myocardial infarction (STEMI) of inferior wall (HCC) 07/11/2021   Urinary incontinence    UTI (urinary tract infection)     Past Surgical History:  Procedure Laterality Date   ABDOMINAL HYSTERECTOMY     2/2 endometriosis and DUB   BLADDER REPAIR     cataract surgery     b/l   CESAREAN SECTION     COLONOSCOPY WITH PROPOFOL N/A 12/10/2019   Procedure: COLONOSCOPY WITH PROPOFOL;  Surgeon: Wyline Mood, MD;  Location: [facility] ENDOSCOPY;  Service: Gastroenterology;  Laterality: N/A;   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  Culprit lesion mRPDA = was too small to address.  20% pLAD.  LCx-LPL normal.  Patent.  RCA ostial 30% RCA dominant.  Hazy plaque rupture and a small 1.5 mm PDA. --> LV Gram EF 50%, Inf HK. Increased LVEDP   TONSILLECTOMY     TRANSTHORACIC ECHOCARDIOGRAM  11/2015   Right is being within normal limits.    Family History  Problem Relation Age of Onset   Other Mother        Old age   Hearing loss Mother    Cancer Father        colon died in 56   CVA Father     Lupus Sister    Lung cancer Brother    Cancer Brother        lung smoker   Heart disease Maternal Grandmother    Dementia Sister    Colon cancer Brother    Cancer Brother        colon   Cancer Son        testicular cancer dx age 42 now 42 as of 12/2018    Cancer Sister        skin   Breast cancer Neg Hx     Social History   Socioeconomic History   Marital status: Single    Spouse name: Not on file   Number of children: 2   Years of education: Not on file   Highest education level: High school graduate  Occupational History   Occupation: Nanny  Tobacco Use   Smoking status: Never    Passive exposure: Never   Smokeless tobacco: Never  Vaping Use   Vaping status: Never Used  Substance and Sexual Activity   Alcohol use: Never   Drug use: Never   Sexual activity: Not Currently  Other Topics Concern   Not on file  Social History Narrative   Recently moved from [place], [place]  Now lives in [place], [place]   Lives alone, but very close to her son Amy Montes and his wife.   Her daughter lives in [place], [place] 1 biological    Has adopted kids x 2    She has 3 grandchildren.   widow      She drinks coffee.     She does water aerobics/pool exercises.      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  Prior to moving to [place], she was the primary caregiver for her brother who passed away in 24-Apr-2017.  Once he died, her children convinced her to move up to [place] to be near them.      6 other siblings 2 1/2 siblings but those 1/2 siblings died in young age   Social Drivers of Health   Financial Resource Strain: Low Risk  (08/21/2022)   Overall Financial Resource Strain (CARDIA)    Difficulty of Paying Living Expenses: Not hard at all  Food Insecurity: No Food Insecurity (08/21/2022)   Hunger Vital Sign    Worried About Running Out of Food in the Last Year: Never true    Ran Out of Food in the Last Year: Never true  Transportation Needs: No Transportation Needs (08/21/2022)    PRAPARE -  Administrator, Civil Service (Medical): No    Lack of Transportation (Non-Medical): No  Physical Activity: Insufficiently Active (08/21/2022)   Exercise Vital Sign    Days of Exercise per Week: 2 days    Minutes of Exercise per Session: 20 min  Stress: No Stress Concern Present (08/21/2022)   [employer] of Occupational Health - Occupational Stress Questionnaire    Feeling of Stress : Not at all  Social Connections: Moderately Isolated (08/21/2022)   Social Connection and Isolation Panel [NHANES]    Frequency of Communication with Friends and Family: More than three times a week    Frequency of Social Gatherings with Friends and Family: More than three times a week    Attends Religious Services: More than 4 times per year    Active Member of [employer] or Organizations: No    Attends Banker Meetings: Never    Marital Status: Widowed  Intimate Partner Violence: Not At Risk (08/21/2022)   Humiliation, Afraid, Rape, and Kick questionnaire    Fear of Current or Ex-Partner: No    Emotionally Abused: No    Physically Abused: No    Sexually Abused: No     Outpatient Medications Prior to Visit  Medication Sig Dispense Refill   aspirin 81 MG chewable tablet Chew by mouth daily.     carvedilol (COREG) 12.5 MG tablet Take 1 tablet (12.5 mg total) by mouth 2 (two) times daily with a meal. 180 tablet 3   levothyroxine (SYNTHROID) 88 MCG tablet Take 1 tablet (88 mcg total) by mouth daily before breakfast. 90 tablet 3   prednisoLONE acetate (PRED FORTE) 1 % ophthalmic suspension 1 Drop(s) Left Eye 4 Times Daily     rosuvastatin (CRESTOR) 10 MG tablet Take 1 tablet (10 mg total) by mouth daily.     Facility-Administered Medications Prior to Visit  Medication Dose Route Frequency Provider Last Rate Last Admin   [START ON 03/29/2023] denosumab (PROLIA) injection 60 mg  60 mg Subcutaneous Once         Allergies  Allergen Reactions   Chocolate     H/a    Cocoa  Nausea And Vomiting    H/a   Lactose Diarrhea   Lactose Intolerance (Gi)     ROS Review of Systems  Neurological:  Negative for tingling and numbness.   Negative unless indicated in HPI.    Objective:    Physical Exam Constitutional:      Appearance: Normal appearance.  HENT:     Head: Normocephalic.  Cardiovascular:     Rate and Rhythm: Normal rate and regular rhythm.     Pulses: Normal pulses.     Heart sounds: Normal heart sounds.  Pulmonary:     Effort: Pulmonary effort is normal.     Breath sounds: Normal breath sounds. No stridor. No wheezing.  Musculoskeletal:     Comments: Bilateral hip pain  Neurological:     Mental Status: She is alert.     BP 116/76   Pulse 65   Temp 97.7 F (36.5 C)   Ht 5' (1.524 m)   Wt 142 lb (64.4 kg)   SpO2 95%   BMI 27.73 kg/m  Wt Readings from Last 3 Encounters:  03/21/23 142 lb (64.4 kg)  03/15/23 142 lb (64.4 kg)  09/04/22 142 lb (64.4 kg)     Health Maintenance  Topic Date Due   COVID-19 Vaccine (2 - Janssen risk series) 03/29/2020   Zoster Vaccines- Shingrix (1  of 2) 06/13/2023 (Originally 07/13/1959)   INFLUENZA VACCINE  06/24/2023 (Originally 10/25/2022)   Pneumonia Vaccine 67+ Years old (1 of 2 - PCV) 03/14/2024 (Originally 07/13/1946)   Medicare Annual Wellness (AWV)  08/21/2023   DEXA SCAN  Completed   HPV VACCINES  Aged Out   DTaP/Tdap/Td  Discontinued   Hepatitis C Screening  Discontinued    There are no preventive care reminders to display for this patient.  Lab Results  Component Value Date   TSH 0.97 09/04/2022   Lab Results  Component Value Date   WBC 6.7 09/04/2022   HGB 13.1 09/04/2022   HCT 40.7 09/04/2022   MCV 97.8 09/04/2022   PLT 237.0 09/04/2022   Lab Results  Component Value Date   NA 134 (L) 09/04/2022   K 4.5 09/04/2022   CO2 27 09/04/2022   GLUCOSE 103 (H) 09/04/2022   BUN 15 09/04/2022   CREATININE 0.83 09/04/2022   BILITOT 0.4 09/04/2022   ALKPHOS 62 09/04/2022   AST 20  09/04/2022   ALT 12 09/04/2022   PROT 7.6 09/04/2022   ALBUMIN 4.0 09/04/2022   CALCIUM 8.9 09/04/2022   GFR 66.71 09/04/2022   Lab Results  Component Value Date   CHOL 119 09/04/2022   Lab Results  Component Value Date   HDL 59.00 09/04/2022   Lab Results  Component Value Date   LDLCALC 45 09/04/2022   Lab Results  Component Value Date   TRIG 74.0 09/04/2022   Lab Results  Component Value Date   CHOLHDL 2 09/04/2022   Lab Results  Component Value Date   HGBA1C 6.1 09/04/2022      Assessment & Plan:  Bilateral hip pain Assessment & Plan: Patient had limited range of motion, patient explained that she is not able to perform her routine activities.  She denies any injury mechanism. Will treat with meloxican 7.5 mg, last eGFR 66.7  and liver function stable on 09/04/22 The x-ray of bilateral hips showed no evidence of hip fracture or dislocation. No acute displaced fracture or dislocation of either hips. If symptoms not improving, Would refer to sports medicine for further evaluation.      Other orders -     Meloxicam; Take 1 tablet (7.5 mg total) by mouth daily.  Dispense: 12 tablet; Refill: 0    Follow-up: Return if symptoms worsen or fail to improve.   Kara Dies, NP

## 2023-03-22 ENCOUNTER — Ambulatory Visit: Payer: Self-pay | Admitting: Family Medicine

## 2023-03-22 ENCOUNTER — Encounter: Payer: Self-pay | Admitting: Nurse Practitioner

## 2023-03-22 ENCOUNTER — Telehealth: Payer: Self-pay

## 2023-03-22 DIAGNOSIS — M25551 Pain in right hip: Secondary | ICD-10-CM | POA: Insufficient documentation

## 2023-03-22 NOTE — Telephone Encounter (Signed)
Noted. Patient should keep her visit that is scheduled for next week, though should be seen sooner if symptoms are worsening at al

## 2023-03-22 NOTE — Progress Notes (Signed)
Please inform pt: The Xray is negative for hip fracture and dislocation. How are the symptoms now? We can refer her to ortho for further evaluation. Does she has any preference?

## 2023-03-22 NOTE — Assessment & Plan Note (Addendum)
Patient had limited range of motion, patient explained that she is not able to perform her routine activities.  She denies any injury mechanism. Will treat with meloxican 7.5 mg, last eGFR 66.7  and liver function stable on 09/04/22 The x-ray of bilateral hips showed no evidence of hip fracture or dislocation. No acute displaced fracture or dislocation of either hips. If symptoms not improving, Would refer to sports medicine for further evaluation.

## 2023-03-22 NOTE — Progress Notes (Signed)
Noted  

## 2023-03-22 NOTE — Telephone Encounter (Signed)
Pt ready for scheduling for Prolia on or after : 03/15/23 (shot given)  Out-of-pocket cost due at time of visit: $0  Number of injection/visits approved: --  Primary: [place] Medicare Prolia co-insurance: 0% Admin fee co-insurance: 0%  Secondary: Tricare for Life - MEDSUP Prolia co-insurance: 100% Admin fee co-insurance: 100%  Medical Benefit Details: Date Benefits were checked: 12/27/22 Deductible: $240 (met)/ Coinsurance: 100%/ Admin Fee: 100%  Prior Auth: not required PA# Expiration Date:   # of doses approved:  Pharmacy benefit: Copay $-- If patient wants fill through the pharmacy benefit please send prescription to:  -- , and include estimated need by date in rx notes. Pharmacy will ship medication directly to the office.  Patient not eligible for Prolia Copay Card. Copay Card can make patient's cost as little as $25. Link to apply: [email or web address]://[email or web address].[email or web address].[email or web address]/copay  ** This summary of benefits is an estimation of the patient's out-of-pocket cost. Exact cost may very based on individual plan coverage.

## 2023-03-22 NOTE — Telephone Encounter (Signed)
  Chief Complaint: hip pain Symptoms: hurts to move Frequency: when moving  Disposition: [] ED /[] Urgent Care (no appt availability in office) / [x] Appointment(In office/virtual)/ []  [facility]/ [] Home Care/ [] Refused Recommended Disposition /[] [facility]/ []  Follow-up with PCP Additional Notes: Pt had Pirlia shot on 12/20. On 12/21, pt noticed bilateral hip. Pt has been taking these shots for about 7 years and never experienced this before. Pt saw provider 12/26 and Bilateral xray ordered. No fx noted. Meloxicam ordered. Pt taking 2 doses so far. Daughter-in-law Fleet Contras is worried pt is in more pain than letting on . Pt stated pain is 4/10. Pt stated she gets some relief from Meloxicam. Has taken 2 tylenol. Pt grimaces with any movement. Pt denies any other symptoms. Appt made for 03/28/23. Appt for 12/30 was offered, but daughter in law refused. RN gave care advice and to call back if symptoms worsen. Daughter in law verbalized understanding.              Reason for Disposition  [1] MODERATE pain (e.g., interferes with normal activities, limping) AND [2] present > 3 days  Answer Assessment - Initial Assessment Questions 1. LOCATION and RADIATION: "Where is the pain located?"      Hips/"saddle"/ tailbone 2. QUALITY: "What does the pain feel like?"  (e.g., sharp, dull, aching, burning)     Dull ache- sharp at times 3. SEVERITY: "How bad is the pain?" "What does it keep you from doing?"   (Scale 1-10; or mild, moderate, severe)   -  MILD (1-3): doesn't interfere with normal activities    -  MODERATE (4-7): interferes with normal activities (e.g., work or school) or awakens from sleep, limping    -  SEVERE (8-10): excruciating pain, unable to do any normal activities, unable to walk     4 4. ONSET: "When did the pain start?" "Does it come and go, or is it there all the time?"     12/22 5. WORK OR EXERCISE: "Has there been any recent work or exercise that involved  this part of the body?"      no 6. CAUSE: "What do you think is causing the hip pain?"      After getting Pirlia shot in office 7. AGGRAVATING FACTORS: "What makes the hip pain worse?" (e.g., walking, climbing stairs, running)     moving 8. OTHER SYMPTOMS: "Do you have any other symptoms?" (e.g., back pain, pain shooting down leg,  fever, rash)     Denies all  Protocols used: Hip Pain-A-AH

## 2023-03-22 NOTE — Telephone Encounter (Signed)
Patient is agreeable to keep her appointment for next week but if her symptoms worsen at all then she knows to be evaluated sooner.

## 2023-03-28 ENCOUNTER — Ambulatory Visit: Payer: TRICARE For Life (TFL) | Admitting: Internal Medicine

## 2023-03-28 ENCOUNTER — Ambulatory Visit: Payer: TRICARE For Life (TFL) | Admitting: Family Medicine

## 2023-03-28 ENCOUNTER — Encounter: Payer: Self-pay | Admitting: Nurse Practitioner

## 2023-03-28 ENCOUNTER — Ambulatory Visit (INDEPENDENT_AMBULATORY_CARE_PROVIDER_SITE_OTHER): Payer: Medicare Other | Admitting: Nurse Practitioner

## 2023-03-28 VITALS — BP 114/68 | HR 68 | Temp 97.4°F | Ht 60.0 in | Wt 142.8 lb

## 2023-03-28 DIAGNOSIS — M25551 Pain in right hip: Secondary | ICD-10-CM | POA: Diagnosis not present

## 2023-03-28 DIAGNOSIS — M25552 Pain in left hip: Secondary | ICD-10-CM | POA: Diagnosis not present

## 2023-03-30 ENCOUNTER — Other Ambulatory Visit: Payer: Self-pay | Admitting: Nurse Practitioner

## 2023-04-01 NOTE — Assessment & Plan Note (Signed)
 The x-ray of bilateral hips showed no evidence of hip fracture or dislocation. No acute displaced fracture or dislocation of either hips. Will refer to Ortho for further evaluation.

## 2023-04-01 NOTE — Progress Notes (Signed)
 Established Patient Office Visit  Subjective:  Patient ID: Amy Montes, female    DOB: [birth year]  Age: 83 y.o. MRN: [id number]  CC:  Chief Complaint  Patient presents with   Acute Visit    Hip pain    HPI  Amy Montes presents for bilateral hip pain. She was seen on 12/26 for bilateral hip pain.  Patient correlates after Prolia  injection.  The x-rays of bilateral hip was negative for any fracture or dislocation or arthropathy.  Patient states that her pain is managed by meloxicam , but has not been on completely.     Past Medical History:  Diagnosis Date   Asthma    childhood   Asthma    childhood   Chicken pox    Chronic back pain    Said spontaneous thoracic and lumbar spine fractures secondary to falls.   Colon polyps    Coronary artery disease involving native coronary artery without angina pectoris 07/2016   In setting of inferior STEMI, cardiac cath showed occluded RCA.  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  Injured her face. ->  Both aspirin and Plavix were stopped. 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 (no stents)    History of subdural hematoma 07/11/2021   History of vertebral fracture 07/11/2021   Hyperinflation of lungs 07/03/2020   Hyperlipidemia with target LDL less than 70    On rosuvastatin    Hyperlipidemia with target LDL less than 70    On rosuvastatin    Hypothyroidism (acquired)    On Synthroid    Intracranial hemorrhage (HCC)    s/p fall years ago when on ? plavix so this was stopped    Multiple fracture    x 5 vertebrae     Preoperative evaluation to rule out surgical contraindication 07/11/2021   RUQ abdominal pain 01/30/2019   ST elevation myocardial infarction (STEMI) of inferior wall (HCC) 07/11/2021   Urinary incontinence    UTI (urinary tract infection)     Past Surgical History:  Procedure Laterality Date   ABDOMINAL HYSTERECTOMY     2/2 endometriosis and DUB   BLADDER REPAIR     cataract surgery     b/l   CESAREAN SECTION     COLONOSCOPY WITH PROPOFOL  N/A 12/10/2019   Procedure: COLONOSCOPY WITH PROPOFOL ;  Surgeon: Therisa Bi, MD;  Location[facility] ENDOSCOPY;  Service: Gastroenterology;  Laterality: N/A;   CORNEAL TRANSPLANT Left    Apparently was a failed attempt.2018 Dr. 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  Culprit lesion mRPDA = was too small to address.  20% pLAD.  LCx-LPL normal.  Patent.  RCA ostial 30% RCA dominant.  Hazy plaque rupture and a small 1.5 mm PDA. --> LV Gram EF 50%, Inf HK. Increased LVEDP   TONSILLECTOMY  TRANSTHORACIC ECHOCARDIOGRAM  11/2015   Right is being within normal limits.    Family History  Problem Relation Age of Onset   Other Mother        Old age   Hearing loss Mother    Cancer Father        colon died in 54   CVA Father    Lupus Sister    Lung cancer Brother    Cancer Brother        lung smoker   Heart disease Maternal Grandmother    Dementia Sister    Colon cancer Brother    Cancer Brother        colon   Cancer Son        testicular cancer dx age 25 now 53 as of 12/2018    Cancer Sister        skin   Breast cancer Neg Hx     Social History   Socioeconomic History   Marital status: Single    Spouse name: Not on file   Number of children: 2   Years of education: Not on file   Highest education level: High school graduate  Occupational History   Occupation: Nanny  Tobacco Use    Smoking status: Never    Passive exposure: Never   Smokeless tobacco: Never  Vaping Use   Vaping status: Never Used  Substance and Sexual Activity   Alcohol  use: Never   Drug use: Never   Sexual activity: Not Currently  Other Topics Concern   Not on file  Social History Narrative   Recently moved from [place], [place]  (near [place]  Now lives in [place], [place]   Lives alone, but very close to her son Marty and his wife.   Her daughter lives in [place], [place] 1 biological    Has adopted kids x 2    She has 3 grandchildren.   widow      She drinks coffee.     She does water aerobics/pool exercises.      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  Prior to moving to [place], she was the primary caregiver for her brother who passed away in 03-14-18.  Once he died, her children convinced her to move up to [place] to be near them.      6 other siblings 2 1/2 siblings but those 1/2 siblings died in young age   Social Drivers of Health   Financial Resource Strain: Low Risk  (08/21/2022)   Overall Financial Resource Strain (CARDIA)    Difficulty of Paying Living Expenses: Not hard at all  Food Insecurity: No Food Insecurity (08/21/2022)   Hunger Vital Sign    Worried About Running Out of Food in the Last Year: Never true    Ran Out of Food in the Last Year: Never true  Transportation Needs: No Transportation Needs (08/21/2022)   PRAPARE - Administrator, Civil Service (Medical): No    Lack of Transportation (Non-Medical): No  Physical Activity: Insufficiently Active (08/21/2022)   Exercise Vital Sign    Days of Exercise per Week: 2 days    Minutes of Exercise per Session: 20 min  Stress: No Stress Concern Present (08/21/2022)   [employer] of Occupational Health - Occupational Stress Questionnaire    Feeling of Stress : Not at all  Social Connections: Moderately Isolated (08/21/2022)   Social Connection and Isolation Panel [NHANES]    Frequency of Communication with Friends and  Family:  More than three times a week    Frequency of Social Gatherings with Friends and Family: More than three times a week    Attends Religious Services: More than 4 times per year    Active Member of Clubs or Organizations: No    Attends Banker Meetings: Never    Marital Status: Widowed  Intimate Partner Violence: Not At Risk (08/21/2022)   Humiliation, Afraid, Rape, and Kick questionnaire    Fear of Current or Ex-Partner: No    Emotionally Abused: No    Physically Abused: No    Sexually Abused: No     Outpatient Medications Prior to Visit  Medication Sig Dispense Refill   aspirin 81 MG chewable tablet Chew by mouth daily.     carvedilol  (COREG ) 12.5 MG tablet Take 1 tablet (12.5 mg total) by mouth 2 (two) times daily with a meal. 180 tablet 3   levothyroxine  (SYNTHROID ) 88 MCG tablet Take 1 tablet (88 mcg total) by mouth daily before breakfast. 90 tablet 3   meloxicam  (MOBIC ) 7.5 MG tablet Take 1 tablet (7.5 mg total) by mouth daily. 12 tablet 0   prednisoLONE acetate (PRED FORTE) 1 % ophthalmic suspension 1 Drop(s) Left Eye 4 Times Daily     rosuvastatin  (CRESTOR ) 10 MG tablet Take 1 tablet (10 mg total) by mouth daily.     Facility-Administered Medications Prior to Visit  Medication Dose Route Frequency Provider Last Rate Last Admin   denosumab  (PROLIA ) injection 60 mg  60 mg Subcutaneous Once         Allergies  Allergen Reactions   Chocolate     H/a    Cocoa Nausea And Vomiting    H/a   Lactose Diarrhea   Lactose Intolerance (Gi)     ROS Review of Systems  Neurological:  Negative for numbness.   Negative unless indicated in HPI.    Objective:    Physical Exam Constitutional:      Appearance: Normal appearance.  HENT:     Head: Normocephalic.  Cardiovascular:     Rate and Rhythm: Normal rate and regular rhythm.     Pulses: Normal pulses.     Heart sounds: Normal heart sounds.  Pulmonary:     Effort: Pulmonary effort is normal.     Breath  sounds: Normal breath sounds. No stridor. No wheezing.  Musculoskeletal:     Comments: Bilateral hip pain  Neurological:     Mental Status: She is alert.     BP 114/68   Pulse 68   Temp (!) 97.4 F (36.3 C)   Ht 5' (1.524 m)   Wt 142 lb 12.8 oz (64.8 kg)   SpO2 97%   BMI 27.89 kg/m  Wt Readings from Last 3 Encounters:  03/28/23 142 lb 12.8 oz (64.8 kg)  03/21/23 142 lb (64.4 kg)  03/15/23 142 lb (64.4 kg)     Health Maintenance  Topic Date Due   COVID-19 Vaccine (2 - Janssen risk series) 04/13/2023 (Originally 03/29/2020)   Zoster Vaccines- Shingrix (1 of 2) 06/13/2023 (Originally 07/13/1959)   INFLUENZA VACCINE  06/24/2023 (Originally 10/25/2022)   Pneumonia Vaccine 37+ Years old (1 of 2 - PCV) 03/14/2024 (Originally 07/13/1946)   Medicare Annual Wellness (AWV)  08/21/2023   DEXA SCAN  Completed   HPV VACCINES  Aged Out   DTaP/Tdap/Td  Discontinued   Hepatitis C Screening  Discontinued    There are no preventive care reminders to display for this patient.  Lab Results  Component Value Date   TSH 0.97 09/04/2022   Lab Results  Component Value Date   WBC 6.7 09/04/2022   HGB 13.1 09/04/2022   HCT 40.7 09/04/2022   MCV 97.8 09/04/2022   PLT 237.0 09/04/2022   Lab Results  Component Value Date   NA 134 (L) 09/04/2022   K 4.5 09/04/2022   CO2 27 09/04/2022   GLUCOSE 103 (H) 09/04/2022   BUN 15 09/04/2022   CREATININE 0.83 09/04/2022   BILITOT 0.4 09/04/2022   ALKPHOS 62 09/04/2022   AST 20 09/04/2022   ALT 12 09/04/2022   PROT 7.6 09/04/2022   ALBUMIN 4.0 09/04/2022   CALCIUM  8.9 09/04/2022   GFR 66.71 09/04/2022   Lab Results  Component Value Date   CHOL 119 09/04/2022   Lab Results  Component Value Date   HDL 59.00 09/04/2022   Lab Results  Component Value Date   LDLCALC 45 09/04/2022   Lab Results  Component Value Date   TRIG 74.0 09/04/2022   Lab Results  Component Value Date   CHOLHDL 2 09/04/2022   Lab Results  Component Value Date    HGBA1C 6.1 09/04/2022      Assessment & Plan:  Bilateral hip pain Assessment & Plan: The x-ray of bilateral hips showed no evidence of hip fracture or dislocation. No acute displaced fracture or dislocation of either hips. Will refer to Ortho for further evaluation.    Orders: -     Ambulatory referral to Sports Medicine     Follow-up: No follow-ups on file.   Rosealie Reach, NP

## 2023-04-02 ENCOUNTER — Telehealth: Payer: Self-pay

## 2023-04-02 NOTE — Telephone Encounter (Signed)
 Spoke to Patient she states she saw [facility] who did imaging but did not find anything. Patient states [facility] said the Meloxicam  had been denied on 04/01/23. Patient started taking Tylenol  1000 mg TID. Patient states [facility] said she should wait until the end of the month and if not better then they will send her for a injection in her spine. Patient would like to hold off on Meloxicam  for now and see if she can tolerate the pain with the Tylenol .

## 2023-04-02 NOTE — Telephone Encounter (Signed)
 Sent to [place] NP who prescribed medication to the pt.

## 2023-04-02 NOTE — Telephone Encounter (Signed)
 Please call the pt daughter in law for medication and her visit with [facility].

## 2023-04-02 NOTE — Telephone Encounter (Signed)
 Copied from CRM [id number]. Topic: Clinical - Medical Advice >> Apr 02, 2023 11:19 AM Merlynn LABOR wrote: Reason for CRM: Patient called in regarding speaking with nurse for prescription refill. Patient was advised request was refused. Refill status shows patient requested refill too soon but patient is only receiving 12 tablets. Please contact patients daughter in law to discuss refill for medication. She can be reached at [phone].

## 2023-04-02 NOTE — Telephone Encounter (Signed)
 Noted.

## 2023-04-02 NOTE — Telephone Encounter (Signed)
 Called pt daughter in law she stated that pt takes 1 meloxicam 7.5 MG tablet in the am. Then takes 2 500mg  Tylenol in the mid afternoon and takes 2 500mg   before bed time.

## 2023-05-24 ENCOUNTER — Other Ambulatory Visit: Payer: Self-pay | Admitting: Family Medicine

## 2023-05-24 DIAGNOSIS — E785 Hyperlipidemia, unspecified: Secondary | ICD-10-CM

## 2023-05-24 MED ORDER — ROSUVASTATIN CALCIUM 10 MG PO TABS: 10.0000 mg | ORAL_TABLET | Freq: Every day | ORAL | 3 refills | Status: DC

## 2023-05-24 NOTE — Telephone Encounter (Signed)
 Copied from CRM [id number]. Topic: Clinical - Medication Refill >> May 24, 2023  8:53 AM Irine Seal wrote: Most Recent Primary Care Visit:  Provider: Kara Dies  Department: [facility]  Visit Type: ACUTE  Date: 03/28/2023  Medication: rosuvastatin (CRESTOR) 10 MG tablet  Has the patient contacted their pharmacy? No (Agent: If no, request that the patient contact the pharmacy for the refill. If patient does not wish to contact the pharmacy document the reason why and proceed with request.) (Agent: If yes, when and what did the pharmacy advise?) Patient was instructed at the last visit to inform when her prescription for rosuvastatin tablet was used up. She is requesting a refill and plans to step down the dosage.  Is this the correct pharmacy for this prescription? Yes If no, delete pharmacy and type the correct one.  This is the patient's preferred pharmacy:  [id number] - [place], [place] - [address] [facility] [address] [place] [address] Phone: [phone] Fax: [phone]   Has the prescription been filled recently? No  Is the patient out of the medication? Yes  Has the patient been seen for an appointment in the last year OR does the patient have an upcoming appointment? Yes  Can we respond through MyChart? Yes  Agent: Please be advised that Rx refills may take up to 3 business days. We ask that you follow-up with your pharmacy.

## 2023-05-30 ENCOUNTER — Telehealth: Payer: Self-pay

## 2023-05-30 ENCOUNTER — Other Ambulatory Visit: Payer: Self-pay

## 2023-05-30 DIAGNOSIS — E785 Hyperlipidemia, unspecified: Secondary | ICD-10-CM

## 2023-05-30 MED ORDER — ROSUVASTATIN CALCIUM 10 MG PO TABS: 10.0000 mg | ORAL_TABLET | Freq: Every day | ORAL | 1 refills | Status: DC

## 2023-05-30 NOTE — Telephone Encounter (Signed)
 Copied from CRM [id number]. Topic: Clinical - Prescription Issue >> May 30, 2023  9:23 AM Kathryne Eriksson wrote: Reason for CRM: rosuvastatin (CRESTOR) 10 MG tablet >> May 30, 2023  9:25 AM Kathryne Eriksson wrote: Patient states since having the dosage amount lowered, she hasn't been able to pick it up from the pharmacy because it seems as though it hasn't been sent over.

## 2023-05-30 NOTE — Telephone Encounter (Signed)
 Called pt and let her know that the medication was resent to pharmacy.

## 2023-06-13 ENCOUNTER — Ambulatory Visit (INDEPENDENT_AMBULATORY_CARE_PROVIDER_SITE_OTHER): Payer: TRICARE For Life (TFL) | Admitting: Family Medicine

## 2023-06-13 ENCOUNTER — Encounter: Payer: Self-pay | Admitting: Family Medicine

## 2023-06-13 VITALS — BP 128/74 | HR 67 | Temp 97.9°F | Resp 20 | Ht 60.0 in | Wt 139.5 lb

## 2023-06-13 DIAGNOSIS — E785 Hyperlipidemia, unspecified: Secondary | ICD-10-CM

## 2023-06-13 DIAGNOSIS — I10 Essential (primary) hypertension: Secondary | ICD-10-CM

## 2023-06-13 DIAGNOSIS — E559 Vitamin D deficiency, unspecified: Secondary | ICD-10-CM | POA: Diagnosis not present

## 2023-06-13 DIAGNOSIS — E039 Hypothyroidism, unspecified: Secondary | ICD-10-CM | POA: Diagnosis not present

## 2023-06-13 DIAGNOSIS — M8000XD Age-related osteoporosis with current pathological fracture, unspecified site, subsequent encounter for fracture with routine healing: Secondary | ICD-10-CM

## 2023-06-13 NOTE — Progress Notes (Signed)
 SUBJECTIVE:   Chief Complaint  Patient presents with   Medical Management of Chronic Issues    3 month f/u   HPI Presents for follow up chronic disease management  Discussed the use of AI scribe software for clinical note transcription with the patient, who gave verbal consent to proceed.  History of Present Illness Amy Montes is an 83 year old female who presents for blood pressure management.  She notes that her home blood pressure readings have been mostly in the 120s, with occasional elevations to the 140s, particularly in the afternoon. When elevated, she relaxes and retakes it, often finding it lower upon recheck. She is currently taking carvedilol, one tablet in the morning and one in the evening, and has stopped taking losartan as her blood pressure was within normal range at home. No chest pain, shortness of breath, or swelling in her legs.  She has a history of a heart attack approximately seven years ago, after which she was placed on multiple medications. However, no blockages were found, and her heart enzymes were not elevated at that time. She has since reduced the number of medications she takes.  She discusses a recent adverse reaction to a Prolia injection, which she received in her arm. The day after the injection, she experienced severe hip pain, which resolved spontaneously after a few weeks. X-rays showed no fractures, and she was treated with pain medication. She has had multiple Prolia injections in the past without issue, making this reaction unusual.      PERTINENT PMH / PSH: As above  OBJECTIVE:  BP 128/74   Pulse 67   Temp 97.9 F (36.6 C)   Resp 20   Ht 5' (1.524 m)   Wt 139 lb 8 oz (63.3 kg)   SpO2 98%   BMI 27.24 kg/m    Physical Exam Vitals reviewed.  Constitutional:      General: She is not in acute distress.    Appearance: Normal appearance. She is normal weight. She is not ill-appearing, toxic-appearing or diaphoretic.  Eyes:      General:        Right eye: No discharge.        Left eye: No discharge.     Conjunctiva/sclera: Conjunctivae normal.  Cardiovascular:     Rate and Rhythm: Normal rate and regular rhythm.     Heart sounds: Normal heart sounds.  Pulmonary:     Effort: Pulmonary effort is normal.     Breath sounds: Normal breath sounds.  Abdominal:     General: Bowel sounds are normal.  Musculoskeletal:        General: Normal range of motion.  Skin:    General: Skin is warm and dry.  Neurological:     General: No focal deficit present.     Mental Status: She is alert and oriented to person, place, and time. Mental status is at baseline.  Psychiatric:        Mood and Affect: Mood normal.        Behavior: Behavior normal.        Thought Content: Thought content normal.        Judgment: Judgment normal.           06/13/2023   11:25 AM 03/28/2023    2:38 PM 03/21/2023   11:49 AM 03/15/2023   11:29 AM 09/04/2022    8:07 AM  Depression screen PHQ 2/9  Decreased Interest 0 0 0 0 0  Down, Depressed, Hopeless  0 0 0 0 0  PHQ - 2 Score 0 0 0 0 0  Altered sleeping 0 0 0 0 0  Tired, decreased energy 0 0 0 0 0  Change in appetite 0 0 0 0 0  Feeling bad or failure about yourself  0 0 0 0 0  Trouble concentrating 0 0 0 0 0  Moving slowly or fidgety/restless 0 0 0 0 0  Suicidal thoughts 0 0 0 0 0  PHQ-9 Score 0 0 0 0 0  Difficult doing work/chores Not difficult at all Not difficult at all Not difficult at all Not difficult at all Not difficult at all      06/13/2023   11:25 AM 03/28/2023    2:38 PM 03/21/2023   11:50 AM 03/15/2023   11:29 AM  GAD 7 : Generalized Anxiety Score  Nervous, Anxious, on Edge 0 0 0 0  Control/stop worrying 0 0 0 0  Worry too much - different things 0 0 0 0  Trouble relaxing 0 0 0 0  Restless 0 0 0 0  Easily annoyed or irritable 0 0 0 0  Afraid - awful might happen 0 0 0 0  Total GAD 7 Score 0 0 0 0  Anxiety Difficulty Not difficult at all Not difficult at all Not  difficult at all Not difficult at all    ASSESSMENT/PLAN:  Essential hypertension Assessment & Plan: Blood pressure mostly normal with occasional elevations. Current reading 128/74 mmHg. On carvedilol, previously stopped losartan. No hypertension-related symptoms. JNCA guidelines suggest BP <150/90 mmHg for age  - Continue carvedilol. - Monitor and record home BP. - Contact provider if BP >150/90 mmHg for possible losartan restart. - Verify home BP machine accuracy at next appointment. - Follow up in May.  Orders: -     Comprehensive metabolic panel with GFR; Future  Hypothyroidism, unspecified type Assessment & Plan: Well controlled -Continue Levothyroxine 88 mcg daily -Check TSH  Orders: -     TSH; Future  Hyperlipidemia, unspecified hyperlipidemia type Assessment & Plan: On atorvastatin 10 mg, reduced from 20 mg. Blood work needed to assess cholesterol levels and dosage adequacy. - Continue atorvastatin 10 mg. - Check fasting lipids  Orders: -     Lipid panel; Future  Vitamin D deficiency Assessment & Plan: Check Vitamin D level  Orders: -     VITAMIN D 25 Hydroxy (Vit-D Deficiency, Fractures); Future  Osteoporosis with current pathological fracture with routine healing, unspecified osteoporosis type, subsequent encounter Assessment & Plan: Severe hip pain post-Prolia resolved. No fractures. Had not had pain with previous injections. Patient concerned about Prolia side effects and considering discontinuation. Informed of increase fracture risk upon stopping. - Consider Prolia discontinuation after risk-benefit discussion. - Ensure adequate vitamin D and calcium intake. - Discuss Prolia continuation or discontinuation in May.       PDMP reviewed  Return in about 3 months (around 09/13/2023) for PCP, HTN.  Dana Allan, MD

## 2023-06-13 NOTE — Patient Instructions (Signed)
 It was a pleasure meeting you today. Thank you for allowing me to take part in your health care.  Our goals for today as we discussed include:  Schedule fasting lab appointment before next visit  Continue current medication Continue to monitor blood pressure.  Goal <150/90 Bring in blood pressure monitor at next visit    This is a list of the screening recommended for you and due dates:  Health Maintenance  Topic Date Due   COVID-19 Vaccine (2 - Janssen risk series) 03/29/2020   Zoster (Shingles) Vaccine (1 of 2) 06/13/2023*   Flu Shot  06/24/2023*   Pneumonia Vaccine (1 of 2 - PCV) 03/14/2024*   Medicare Annual Wellness Visit  08/21/2023   DEXA scan (bone density measurement)  Completed   HPV Vaccine  Aged Out   DTaP/Tdap/Td vaccine  Discontinued   Hepatitis C Screening  Discontinued  *Topic was postponed. The date shown is not the original due date.      If you have any questions or concerns, please do not hesitate to call the office at [phone].  I look forward to our next visit and until then take care and stay safe.  Regards,   Dana Allan, MD   [facility]

## 2023-06-22 ENCOUNTER — Encounter: Payer: Self-pay | Admitting: Family Medicine

## 2023-06-22 DIAGNOSIS — E559 Vitamin D deficiency, unspecified: Secondary | ICD-10-CM | POA: Insufficient documentation

## 2023-06-22 NOTE — Assessment & Plan Note (Signed)
 Severe hip pain post-Prolia resolved. No fractures. Had not had pain with previous injections. Patient concerned about Prolia side effects and considering discontinuation. Informed of increase fracture risk upon stopping. - Consider Prolia discontinuation after risk-benefit discussion. - Ensure adequate vitamin D and calcium intake. - Discuss Prolia continuation or discontinuation in May.

## 2023-06-22 NOTE — Assessment & Plan Note (Signed)
 On atorvastatin 10 mg, reduced from 20 mg. Blood work needed to assess cholesterol levels and dosage adequacy. - Continue atorvastatin 10 mg. - Check fasting lipids

## 2023-06-22 NOTE — Assessment & Plan Note (Addendum)
 Well controlled -Continue Levothyroxine 88 mcg daily -Check TSH

## 2023-06-22 NOTE — Assessment & Plan Note (Signed)
 Check Vitamin D level

## 2023-06-22 NOTE — Assessment & Plan Note (Addendum)
 Blood pressure mostly normal with occasional elevations. Current reading 128/74 mmHg. On carvedilol, previously stopped losartan. No hypertension-related symptoms. JNCA guidelines suggest BP <150/90 mmHg for age  - Continue carvedilol. - Monitor and record home BP. - Contact provider if BP >150/90 mmHg for possible losartan restart. - Verify home BP machine accuracy at next appointment. - Follow up in May.

## 2023-08-14 ENCOUNTER — Inpatient Hospital Stay
Admission: EM | Admit: 2023-08-14 | Discharge: 2023-08-25 | DRG: 951 | Disposition: E | Attending: Internal Medicine | Admitting: Internal Medicine

## 2023-08-14 ENCOUNTER — Other Ambulatory Visit: Payer: Self-pay

## 2023-08-14 ENCOUNTER — Emergency Department

## 2023-08-14 DIAGNOSIS — Z8781 Personal history of (healed) traumatic fracture: Secondary | ICD-10-CM

## 2023-08-14 DIAGNOSIS — Z808 Family history of malignant neoplasm of other organs or systems: Secondary | ICD-10-CM

## 2023-08-14 DIAGNOSIS — Z947 Corneal transplant status: Secondary | ICD-10-CM

## 2023-08-14 DIAGNOSIS — I629 Nontraumatic intracranial hemorrhage, unspecified: Secondary | ICD-10-CM | POA: Diagnosis not present

## 2023-08-14 DIAGNOSIS — Z822 Family history of deafness and hearing loss: Secondary | ICD-10-CM

## 2023-08-14 DIAGNOSIS — Z7982 Long term (current) use of aspirin: Secondary | ICD-10-CM | POA: Diagnosis not present

## 2023-08-14 DIAGNOSIS — M549 Dorsalgia, unspecified: Secondary | ICD-10-CM | POA: Diagnosis present

## 2023-08-14 DIAGNOSIS — Z79890 Hormone replacement therapy (postmenopausal): Secondary | ICD-10-CM

## 2023-08-14 DIAGNOSIS — E039 Hypothyroidism, unspecified: Secondary | ICD-10-CM | POA: Diagnosis present

## 2023-08-14 DIAGNOSIS — I2510 Atherosclerotic heart disease of native coronary artery without angina pectoris: Secondary | ICD-10-CM | POA: Diagnosis present

## 2023-08-14 DIAGNOSIS — Z90710 Acquired absence of both cervix and uterus: Secondary | ICD-10-CM | POA: Diagnosis not present

## 2023-08-14 DIAGNOSIS — Z91018 Allergy to other foods: Secondary | ICD-10-CM

## 2023-08-14 DIAGNOSIS — H9190 Unspecified hearing loss, unspecified ear: Secondary | ICD-10-CM | POA: Diagnosis present

## 2023-08-14 DIAGNOSIS — I252 Old myocardial infarction: Secondary | ICD-10-CM

## 2023-08-14 DIAGNOSIS — Z8269 Family history of other diseases of the musculoskeletal system and connective tissue: Secondary | ICD-10-CM | POA: Diagnosis not present

## 2023-08-14 DIAGNOSIS — Z800 Family history of malignant neoplasm of digestive organs: Secondary | ICD-10-CM | POA: Diagnosis not present

## 2023-08-14 DIAGNOSIS — I10 Essential (primary) hypertension: Secondary | ICD-10-CM | POA: Diagnosis present

## 2023-08-14 DIAGNOSIS — Z823 Family history of stroke: Secondary | ICD-10-CM | POA: Diagnosis not present

## 2023-08-14 DIAGNOSIS — Z515 Encounter for palliative care: Secondary | ICD-10-CM | POA: Diagnosis present

## 2023-08-14 DIAGNOSIS — Z66 Do not resuscitate: Secondary | ICD-10-CM | POA: Diagnosis present

## 2023-08-14 DIAGNOSIS — M810 Age-related osteoporosis without current pathological fracture: Secondary | ICD-10-CM | POA: Diagnosis present

## 2023-08-14 DIAGNOSIS — Z7189 Other specified counseling: Secondary | ICD-10-CM | POA: Diagnosis not present

## 2023-08-14 DIAGNOSIS — I619 Nontraumatic intracerebral hemorrhage, unspecified: Secondary | ICD-10-CM | POA: Diagnosis present

## 2023-08-14 DIAGNOSIS — Z79899 Other long term (current) drug therapy: Secondary | ICD-10-CM

## 2023-08-14 DIAGNOSIS — Z801 Family history of malignant neoplasm of trachea, bronchus and lung: Secondary | ICD-10-CM | POA: Diagnosis not present

## 2023-08-14 DIAGNOSIS — G8929 Other chronic pain: Secondary | ICD-10-CM | POA: Diagnosis present

## 2023-08-14 DIAGNOSIS — S06899A Other specified intracranial injury with loss of consciousness of unspecified duration, initial encounter: Principal | ICD-10-CM

## 2023-08-14 DIAGNOSIS — E739 Lactose intolerance, unspecified: Secondary | ICD-10-CM | POA: Diagnosis present

## 2023-08-14 DIAGNOSIS — E785 Hyperlipidemia, unspecified: Secondary | ICD-10-CM | POA: Diagnosis present

## 2023-08-14 DIAGNOSIS — S0689AA Other specified intracranial injury with loss of consciousness status unknown, initial encounter: Secondary | ICD-10-CM

## 2023-08-14 DIAGNOSIS — H409 Unspecified glaucoma: Secondary | ICD-10-CM | POA: Diagnosis present

## 2023-08-14 DIAGNOSIS — K219 Gastro-esophageal reflux disease without esophagitis: Secondary | ICD-10-CM | POA: Diagnosis present

## 2023-08-14 DIAGNOSIS — Z8249 Family history of ischemic heart disease and other diseases of the circulatory system: Secondary | ICD-10-CM | POA: Diagnosis not present

## 2023-08-14 LAB — URINALYSIS, W/ REFLEX TO CULTURE (INFECTION SUSPECTED)
Bilirubin Urine: NEGATIVE
Glucose, UA: 150 mg/dL — AB
Ketones, ur: 20 mg/dL — AB
Leukocytes,Ua: NEGATIVE
Nitrite: NEGATIVE
Protein, ur: >=300 mg/dL — AB
Specific Gravity, Urine: 1.018 (ref 1.005–1.030)
pH: 6 (ref 5.0–8.0)

## 2023-08-14 LAB — COMPREHENSIVE METABOLIC PANEL WITH GFR
ALT: 24 U/L (ref 0–44)
AST: 70 U/L — ABNORMAL HIGH (ref 15–41)
Albumin: 3.6 g/dL (ref 3.5–5.0)
Alkaline Phosphatase: 60 U/L (ref 38–126)
Anion gap: 19 — ABNORMAL HIGH (ref 5–15)
BUN: 31 mg/dL — ABNORMAL HIGH (ref 8–23)
CO2: 21 mmol/L — ABNORMAL LOW (ref 22–32)
Calcium: 9.3 mg/dL (ref 8.9–10.3)
Chloride: 93 mmol/L — ABNORMAL LOW (ref 98–111)
Creatinine, Ser: 1.63 mg/dL — ABNORMAL HIGH (ref 0.44–1.00)
GFR, Estimated: 31 mL/min — ABNORMAL LOW (ref >60)
Glucose, Bld: 189 mg/dL — ABNORMAL HIGH (ref 70–99)
Potassium: 3.8 mmol/L (ref 3.5–5.1)
Sodium: 133 mmol/L — ABNORMAL LOW (ref 135–145)
Total Bilirubin: 1.1 mg/dL (ref 0.0–1.2)
Total Protein: 7.7 g/dL (ref 6.5–8.1)

## 2023-08-14 LAB — CBC
HCT: 50.3 % — ABNORMAL HIGH (ref 36.0–46.0)
Hemoglobin: 16.7 g/dL — ABNORMAL HIGH (ref 12.0–15.0)
MCH: 31.6 pg (ref 26.0–34.0)
MCHC: 33.2 g/dL (ref 30.0–36.0)
MCV: 95.3 fL (ref 80.0–100.0)
Platelets: 224 10*3/uL (ref 150–400)
RBC: 5.28 MIL/uL — ABNORMAL HIGH (ref 3.87–5.11)
RDW: 12.6 % (ref 11.5–15.5)
WBC: 21.3 10*3/uL — ABNORMAL HIGH (ref 4.0–10.5)
nRBC: 0 % (ref 0.0–0.2)

## 2023-08-14 LAB — BLOOD GAS, VENOUS

## 2023-08-14 LAB — LIPASE, BLOOD: Lipase: 23 U/L (ref 11–51)

## 2023-08-14 MED ORDER — ONDANSETRON HCL 4 MG/2ML IJ SOLN: 4.0000 mg | Freq: Four times a day (QID) | INTRAMUSCULAR | Status: DC | PRN

## 2023-08-14 MED ORDER — ROCURONIUM BROMIDE 10 MG/ML (PF) SYRINGE
PREFILLED_SYRINGE | INTRAVENOUS | Status: DC
Start: 2023-08-14 — End: 2023-08-14
  Filled 2023-08-14: qty 10

## 2023-08-14 MED ORDER — GLYCOPYRROLATE 1 MG PO TABS: 1.0000 mg | ORAL_TABLET | ORAL | Status: DC | PRN

## 2023-08-14 MED ORDER — ETOMIDATE 2 MG/ML IV SOLN
INTRAVENOUS | Status: AC
  Filled 2023-08-14: qty 10

## 2023-08-14 MED ORDER — LORAZEPAM 2 MG/ML IJ SOLN: 1.0000 mg | INTRAMUSCULAR | Status: DC | PRN

## 2023-08-14 MED ORDER — GLYCOPYRROLATE 0.2 MG/ML IJ SOLN: 0.2000 mg | INTRAMUSCULAR | Status: DC | PRN

## 2023-08-14 MED ORDER — ACETAMINOPHEN 650 MG RE SUPP: 650.0000 mg | Freq: Four times a day (QID) | RECTAL | Status: DC | PRN

## 2023-08-14 MED ORDER — SODIUM CHLORIDE 0.9 % IV BOLUS: 1000.0000 mL | Freq: Once | INTRAVENOUS | Status: DC

## 2023-08-14 MED ORDER — LORAZEPAM 2 MG/ML IJ SOLN
1.0000 mg | Freq: Once | INTRAMUSCULAR | Status: AC
  Administered 2023-08-14: 1 mg via INTRAVENOUS
  Filled 2023-08-14: qty 1

## 2023-08-14 MED ORDER — MORPHINE SULFATE (PF) 2 MG/ML IV SOLN
2.0000 mg | Freq: Once | INTRAVENOUS | Status: AC
  Administered 2023-08-14: 2 mg via INTRAVENOUS
  Filled 2023-08-14: qty 1

## 2023-08-14 MED ORDER — SODIUM CHLORIDE 0.9 % IV SOLN
2.0000 g | Freq: Once | INTRAVENOUS | Status: AC
  Administered 2023-08-14: 2 g via INTRAVENOUS
  Filled 2023-08-14: qty 20

## 2023-08-14 MED ORDER — MORPHINE 100MG IN NS 100ML (1MG/ML) PREMIX INFUSION
1.0000 mg/h | INTRAVENOUS | Status: DC
  Administered 2023-08-14: 1 mg/h via INTRAVENOUS
  Filled 2023-08-14 (×2): qty 100

## 2023-08-14 MED ORDER — HALOPERIDOL 0.5 MG PO TABS: 0.5000 mg | ORAL_TABLET | ORAL | Status: DC | PRN

## 2023-08-14 MED ORDER — POLYVINYL ALCOHOL 1.4 % OP SOLN: 1.0000 [drp] | Freq: Four times a day (QID) | OPHTHALMIC | Status: DC | PRN

## 2023-08-14 MED ORDER — MORPHINE BOLUS VIA INFUSION: 2.0000 mg | INTRAVENOUS | Status: DC | PRN

## 2023-08-14 MED ORDER — HALOPERIDOL LACTATE 2 MG/ML PO CONC: 0.5000 mg | ORAL | Status: DC | PRN

## 2023-08-14 MED ORDER — ONDANSETRON 4 MG PO TBDP: 4.0000 mg | ORAL_TABLET | Freq: Four times a day (QID) | ORAL | Status: DC | PRN

## 2023-08-14 MED ORDER — SODIUM CHLORIDE 0.9 % IV BOLUS
1000.0000 mL | Freq: Once | INTRAVENOUS | Status: AC
  Administered 2023-08-14: 1000 mL via INTRAVENOUS

## 2023-08-14 MED ORDER — HALOPERIDOL LACTATE 5 MG/ML IJ SOLN: 0.5000 mg | INTRAMUSCULAR | Status: DC | PRN

## 2023-08-14 MED ORDER — LORAZEPAM 1 MG PO TABS: 1.0000 mg | ORAL_TABLET | ORAL | Status: DC | PRN

## 2023-08-14 MED ORDER — ACETAMINOPHEN 325 MG PO TABS: 650.0000 mg | ORAL_TABLET | Freq: Four times a day (QID) | ORAL | Status: DC | PRN

## 2023-08-14 MED ORDER — LORAZEPAM 2 MG/ML PO CONC: 1.0000 mg | ORAL | Status: DC | PRN

## 2023-08-14 MED ORDER — BIOTENE DRY MOUTH MT LIQD: 15.0000 mL | OROMUCOSAL | Status: DC | PRN

## 2023-08-14 NOTE — Sepsis Progress Note (Signed)
 eLink is following this Code Sepsis.

## 2023-08-14 NOTE — ED Triage Notes (Signed)
 Pt brought in via [facility] being bagged. Per EMS pt was last heard from on Monday night around 5pm was found today on the floor by family covered in black vomit and urine. Pt unresponsive on arrival.

## 2023-08-14 NOTE — ED Notes (Signed)
 RT at bedside.

## 2023-08-14 NOTE — Consult Note (Signed)
 Consulting Department:  Emergency department  Primary Physician:  Valli Gaw, MD  Chief Complaint: Intracranial hemorrhage  History of Present Illness: 08/14/2023 Amy Montes is a 83 y.o. female who presents with the chief complaint of intracranial hemorrhage.  She was last known normal yesterday at 11 AM, no one had heard from her since so family went over to check on her found her on the ground gasping for breath.  She was unresponsive.  Brought into the emergency department.  For her workup a CT scan was performed which demonstrated a large right sided intraparenchymal hemorrhage with significant amount of mass effect.  Neurosurgery was consulted.  She does have a history of hypertension, coronary artery disease and multiple other comorbidities.  She is on aspirin at home 81 mg  Review of Systems:  A 10 point review of systems is negative, except for the pertinent positives and negatives detailed in the HPI.  Past Medical History: Past Medical History:  Diagnosis Date   Asthma    childhood   Asthma    childhood   Chicken pox    Chronic back pain    Said spontaneous thoracic and lumbar spine fractures secondary to falls.   Colon polyps    Coronary artery disease involving native coronary artery without angina pectoris 07/2016   In setting of inferior STEMI, cardiac cath showed occluded RCA.  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  Injured her face. ->  Both aspirin and Plavix were stopped. 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 (no stents)    History of  subdural hematoma 07/11/2021   History of vertebral fracture 07/11/2021   Hyperinflation of lungs 07/03/2020   Hyperlipidemia with target LDL less than 70    On rosuvastatin    Hyperlipidemia with target LDL less than 70    On rosuvastatin    Hypothyroidism (acquired)    On Synthroid    Intracranial hemorrhage (HCC)    s/p fall years ago when on ? plavix so this was stopped    Multiple fracture    x 5 vertebrae    Preoperative evaluation to rule out surgical contraindication 07/11/2021   RUQ abdominal pain 01/30/2019   ST elevation myocardial infarction (STEMI) of inferior wall (HCC) 07/11/2021   Urinary incontinence    UTI (urinary tract infection)     Past Surgical History: Past Surgical History:  Procedure Laterality Date   ABDOMINAL HYSTERECTOMY     2/2 endometriosis and DUB   BLADDER REPAIR     cataract surgery     b/l   CESAREAN SECTION     COLONOSCOPY WITH PROPOFOL  N/A 12/10/2019   Procedure: COLONOSCOPY WITH PROPOFOL ;  Surgeon: Luke Salaam, MD;  Location: [facility] ENDOSCOPY;  Service: Gastroenterology;  Laterality: N/A;   CORNEAL TRANSPLANT Left    Apparently was a failed attempt.2018 Dr. Cathlean Co in [facility]   CORNEAL TRANSPLANT     08/01/21 this is 3rd eye surgery over 5 years as of 08/21/21   CRANIOTOMY     SAH evaccuation   RIGHT  AND LEFT HEART CATH  07/2016   Inferior STEMI.  Culprit lesion mRPDA = was too small to address.  20% pLAD.  LCx-LPL normal.  Patent.  RCA ostial 30% RCA dominant.  Hazy plaque rupture and a small 1.5 mm PDA. --> LV Gram EF 50%, Inf HK. Increased LVEDP   TONSILLECTOMY     TRANSTHORACIC ECHOCARDIOGRAM  11/2015   Right is being within normal limits.    Allergies: Allergies as of 08/14/2023 - Unable to Assess 08/14/2023  Allergen Reaction Noted   Chocolate  01/07/2019   Cocoa Nausea And Vomiting 01/07/2019   Lactose Diarrhea 01/30/2019   Lactose intolerance (gi)  01/30/2019    Medications:  Current Facility-Administered Medications:    denosumab   (PROLIA ) injection 60 mg, 60 mg, Subcutaneous, Once,    LORazepam (ATIVAN) injection 1 mg, 1 mg, Intravenous, Once, Quale, Mark, MD   morphine (PF) 2 MG/ML injection 2 mg, 2 mg, Intravenous, Once, Quale, Mark, MD   sodium chloride  0.9 % bolus 1,000 mL, 1,000 mL, Intravenous, Once, Iver Marker, MD  Current Outpatient Medications:    aspirin 81 MG chewable tablet, Chew by mouth daily., Disp: , Rfl:    carvedilol  (COREG ) 12.5 MG tablet, Take 1 tablet (12.5 mg total) by mouth 2 (two) times daily with a meal., Disp: 180 tablet, Rfl: 3   levothyroxine  (SYNTHROID ) 88 MCG tablet, Take 1 tablet (88 mcg total) by mouth daily before breakfast., Disp: 90 tablet, Rfl: 3   moxifloxacin (VIGAMOX) 0.5 % ophthalmic solution, Place 1 drop into the left eye in the morning, at noon, in the evening, and at bedtime., Disp: , Rfl:    prednisoLONE acetate (PRED FORTE) 1 % ophthalmic suspension, 1 Drop(s) Left Eye 4 Times Daily, Disp: , Rfl:    rosuvastatin  (CRESTOR ) 10 MG tablet, Take 1 tablet (10 mg total) by mouth daily., Disp: 90 tablet, Rfl: 1   Social History: Social History   Tobacco Use   Smoking status: Never    Passive exposure: Never   Smokeless tobacco: Never  Vaping Use   Vaping status: Never Used  Substance Use Topics   Alcohol use: Never   Drug use: Never    Family Medical History: Family History  Problem Relation Age of Onset   Other Mother        Old age   Hearing loss Mother    Cancer Father        colon died in 3   CVA Father    Lupus Sister    Lung cancer Brother    Cancer Brother        lung smoker   Heart disease Maternal Grandmother    Dementia Sister    Colon cancer Brother    Cancer Brother        colon   Cancer Son        testicular cancer dx age 83 now 71 as of 12/2018    Cancer Sister        skin   Breast cancer Neg Hx     Physical Examination: Vitals:   08/14/23 1344 08/14/23 1400  BP:  116/83  Pulse:  90  Resp:  (!) 39  Temp:  (!) 97.5 F (36.4 C)   SpO2: (!) 88% 90%     General: Patient is well developed, well nourished, calm, collected, and in no apparent distress.  NEUROLOGICAL:  General: In no acute distress.   Patient is laying in bed, eyes are closed, breathing is agonal.  Left  pupil unable to fully assess due to clouding from a corneal transplant, right pupil is pinpoint and nonreactive.  Doll's eye reflex are absent.  No evidence of corneal reflex.  Patient has no response to noxious stimuli.   GCS:3  Imaging: Awaiting formal read, large(200+cc) right sided intraparenchymal hemorrhage causing significant right to left mass effect.  Hypodensity of the brainstem.  Severe right to left midline shift.  Intraventricular hematoma.    I have personally reviewed the images and agree with the above interpretation.  Labs:    Latest Ref Rng & Units 08/14/2023    1:07 PM 09/04/2022    8:31 AM 08/22/2021    9:00 AM  CBC  WBC 4.0 - 10.5 K/uL 21.3  6.7  6.1   Hemoglobin 12.0 - 15.0 g/dL 09.8  11.9  14.7   Hematocrit 36.0 - 46.0 % 50.3  40.7  38.9   Platelets 150 - 400 K/uL 224  237.0  210.0       Latest Ref Rng & Units 08/14/2023    1:07 PM 09/04/2022    8:31 AM 08/22/2021    9:00 AM  BMP  Glucose 70 - 99 mg/dL 829  562  130   BUN 8 - 23 mg/dL 31  15  26    Creatinine 0.44 - 1.00 mg/dL 8.65  7.84  6.96   Sodium 135 - 145 mmol/L 133  134  137   Potassium 3.5 - 5.1 mmol/L 3.8  4.5  4.2   Chloride 98 - 111 mmol/L 93  102  101   CO2 22 - 32 mmol/L 21  27  30    Calcium  8.9 - 10.3 mg/dL 9.3  8.9  9.3         Assessment and Plan: Ms. Mahn is a pleasant 83 y.o. female with history of cardiovascular disease, hyperlipidemia, hypertension, previous ICH.  Was found down this afternoon after no one had heard from her since 11 AM the morning before.  She was having some intermittent headaches.  She is brought into the emergency department unresponsive with agonal breathing.  Physical exam she has no response to noxious stimuli.  She  has a pinpoint right-sided pupil with no corneal reflex.  No doll's eye reflex is present.  CT scan of the head is being formally reviewed but shows a massive right sided intracranial hemorrhage with significant right to left midline shift and signs of herniation.  Hypodensity noted in the brainstem.  At this point the patient's ICH stroke score is a 5 making this a likely unsurvivable event, we discussed this with the family, they stated that her wishes were to not have any heroic measures taken.  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   I was present and answered questions for family to their approval.  They were in agreement that she would not want to have any surgical intervention including any craniotomy or evacuation of the hematoma as this would not be consistent with her wishes.  Carroll Clamp, MD/MSCR Dept. of Neurosurgery

## 2023-08-14 NOTE — ED Notes (Signed)
 Dr Kent Pear in with pt and family

## 2023-08-14 NOTE — ED Provider Notes (Signed)
 [facility] Provider Note    Event Date/Time   First MD Initiated Contact with Patient 08/14/23 1300     (approximate)   History    EM caveat: Unresponsive  HPI  Amy Montes is a 83 y.o. female EMS reports family found the patient just shortly ago unresponsive in her kitchen.  There was notable dried emesis urine and they suspect family last talked to her a couple days ago   Patient's son and daughter-in-law reports she was fine when they saw her at church, they texted on Monday.  They had not heard from her for a day so son went to the house to check on her and found her unresponsive     Physical Exam   Triage Vital Signs: ED Triage Vitals [08/14/23 1302]  Encounter Vitals Group     BP      Systolic BP Percentile      Diastolic BP Percentile      Pulse      Resp      Temp      Temp src      SpO2 (!) 70 %     Weight      Height      Head Circumference      Peak Flow      Pain Score      Pain Loc      Pain Education      Exclude from Growth Chart     Most recent vital signs: Vitals:   08/14/23 1430 08/14/23 1500  BP: (!) 152/71 135/70  Pulse: 89 88  Resp: (!) 35 (!) 33  Temp: (!) 96.7 F (35.9 C) (!) 96.8 F (36 C)  SpO2: 95% 100%     General: The patient is nonresponsive to any stimuli.  Flaccid in all extremities.  Patient being bag-valve-mask by EMS on arrival CV:  Good peripheral perfusion. Resp:  Normal effort.  Clear bilateral with BVM.  After placed on nonrebreather patient having fairly shallow somewhat agonal and irregular respiratory pattern at times Abd:  No distention.  Had emesis and urine noted Other:  No spontaneous movements of extremities.  Flaccid in extremities.  No posturing.  Left pupil appears to have chronic scarring overlying.  The right pupil is constricted   ED Results / Procedures / Treatments   Labs (all labs ordered are listed, but only abnormal results are displayed) Labs Reviewed  BLOOD  GAS, VENOUS - Abnormal; Notable for the following components:      Result Value   pCO2, Ven 67 (*)    pO2, Ven <31 (*)    Bicarbonate 32.2 (*)    Acid-Base Excess 3.6 (*)    All other components within normal limits  CBC - Abnormal; Notable for the following components:   WBC 21.3 (*)    RBC 5.28 (*)    Hemoglobin 16.7 (*)    HCT 50.3 (*)    All other components within normal limits  COMPREHENSIVE METABOLIC PANEL WITH GFR - Abnormal; Notable for the following components:   Sodium 133 (*)    Chloride 93 (*)    CO2 21 (*)    Glucose, Bld 189 (*)    BUN 31 (*)    Creatinine, Ser 1.63 (*)    AST 70 (*)    GFR, Estimated 31 (*)    Anion gap 19 (*)    All other components within normal limits  LIPASE, BLOOD  URINALYSIS, W/ REFLEX TO  CULTURE (INFECTION SUSPECTED)   Labs are interpreted including mild hyponatremia, AKI.  Slightly elevated AST.  Significant leukocytosis  EKG  ED ECG REPORT I, Iver Marker, the attending physician, personally viewed and interpreted this ECG.  Date: 08/14/2023 EKG Time: 1430 Rate: 80 Rhythm: normal sinus rhythm QRS Axis: normal Intervals: normal ST/T Wave abnormalities: normal Narrative Interpretation: no evidence of acute ischemia    RADIOLOGY  CT head interpreted by me as grossly positive for large intracranial hemorrhage.  Discussed CT head with Dr. Felipe Horton of neurosurgery, then later discussed with our radiologist   IMPRESSION: Large parenchymal hematoma in the right cerebral hemisphere with significant surrounding edema and associated mass effect with approximately 1.8 cm leftward midline shift.  Decompression of hemorrhage into the ventricular system with blood products noted in both lateral ventricles.  Diffuse sulcal effacement and crowding of the basilar cisterns. There is partial effacement of the fourth ventricle with crowding of structures in the posterior fossa. There is potential for impending downward herniation through  the foramen magnum.  Scattered subarachnoid hemorrhage bilaterally.  No acute fracture or traumatic malalignment of the cervical spine.  Opacities in the partially visualized left upper lobe which could reflect atelectasis or aspiration.  These results were called by telephone at the time of interpretation on 08/14/2023 at 2:43 pm to provider Weston Fulco , who verbally acknowledged these results.   Electronically Signed By: Denny Flack M.D. On: 08/14/2023 14:51    PROCEDURES:  Critical Care performed: Yes, see critical care procedure note(s)  Procedures   MEDICATIONS ORDERED IN ED: Medications  sodium chloride  0.9 % bolus 1,000 mL (has no administration in time range)  sodium chloride  0.9 % bolus 1,000 mL (0 mLs Intravenous Stopped 08/14/23 1513)  cefTRIAXone (ROCEPHIN) 2 g in sodium chloride  0.9 % 100 mL IVPB (0 g Intravenous Stopped 08/14/23 1418)  LORazepam (ATIVAN) injection 1 mg (1 mg Intravenous Given 08/14/23 1354)  morphine (PF) 2 MG/ML injection 2 mg (2 mg Intravenous Given 08/14/23 1459)  LORazepam (ATIVAN) injection 1 mg (1 mg Intravenous Given 08/14/23 1459)     IMPRESSION / MDM / ASSESSMENT AND PLAN / ED COURSE  I reviewed the triage vital signs and the nursing notes.                              Differential diagnosis includes, but is not limited to, possible GI bleeding, acute hypotension, cardiac dysrhythmia, acute anemia, cardiogenic or other shock state, intracranial hemorrhage or massive stroke, etc.  The patient clearly suffering from what appears to be an emergent life-threatening illness.  She is critically ill and require multiple modalities for further evaluation.  Not long after her arrival family at the bedside including her daughter-in-law who her son relates is her healthcare power of attorney.  They advised DO NOT RESUSCITATE DO NOT INTUBATE after discussing patient's known medical desires which she is conferred to her family prior.  Patient's  presentation is most consistent with acute presentation with potential threat to life or bodily function.   The patient is on the cardiac monitor to evaluate for evidence of arrhythmia and/or significant heart rate changes.     Clinical Course as of 08/14/23 1538  Wed Aug 14, 2023  1313 The patient's status appears critical.  I called and spoke with her son as well as patient's daughter-in-law.  Daughter-in-law advises that she is her Oceanographer.  At this time both son and daughter-in-law advised that  she had made previous statements that she would not want to be on a ventilator or receive life-saving care if it looked as though she might be dying.  Discussed with the son and daughter-in-law they are almost here.  At this time advised status DO NOT INTUBATE, no CPR, DO NOT RESUSCITATE.  Continue medical workup [MQ]    Clinical Course User Index [MQ] Iver Marker, MD   ----------------------------------------- 3:38 PM on 08/14/2023 ----------------------------------------- Admitted to the service of Dr. Kent Pear.  Family including son and daughter are at the bedside, current plan is for end-of-life care. No further planned medical interventions aside from comfort  FINAL CLINICAL IMPRESSION(S) / ED DIAGNOSES   Final diagnoses:  Intracranial hematoma with loss of consciousness, initial encounter [facility])     Rx / DC Orders   ED Discharge Orders     None        Note:  This document was prepared using Dragon voice recognition software and may include unintentional dictation errors.   Iver Marker, MD 08/14/23 1539

## 2023-08-14 NOTE — ED Notes (Signed)
 Drinks and blankets passed out to family members.

## 2023-08-14 NOTE — ED Notes (Signed)
 Dr. Katrinka Blazing at bedside.

## 2023-08-14 NOTE — H&P (Signed)
 History and Physical   [facility] - [facility] @ [facility] Admission History and Physical [employer], D.O.    Patient Name: Amy Montes MR#: [id number] Date of Birth: [birth year] Date of Admission: 08/14/2023  Referring MD/NP/PA: Dr. Valetta Gaudy Primary Care Physician: Valli Gaw, MD  Chief Complaint: Unresponsive   please note the entire history is obtained from the patient's emergency department chart, emergency department provider and the patient's family who is at the bedside. Patient's personal history is limited by altered mental status.   HPI: Amy Montes is a 83 y.o. female with a known history of HLD, HTN, CAD, hypothyroidism, GERD, osteoporosis presents to the emergency department for evaluation of unresponsiveness.  Patient was last seen in normal state of health at 11AM yesterday, family went to check on her and found her unresponsive with vomitus on her.   Family denies any report of symptoms such as headache chest pain shortness of breath dizziness or lightheadedness.  She lives independently  Emergency department evaluation revealed massive right sided intracranial hemorrhage with midline shift.  Neurosurgery was consulted.  Patient's daughter-in-law is her healthcare power of attorney and expresses that the patient's wishes are not to have any heroic measures.  The family is in agreement that they would like to keep her comfortable.  EMS/ED Course: Medical admission has been requested for further management of comfort measures/end-of-life care.   Assessment/Plan  This is a 83 y.o. female with a history of HLD, HTN, CAD, hypothyroidism, GERD, osteoporosis now being admitted with:  #.  Right-sided intraparenchymal hemorrhage  After lengthy discussion with the family, all parties are in agreement that patient is DNR/DNI and they would like to pursue comfort measures only.  Admit inpatient Comfort care measures only: Specifically no vital signs, blood work, feeding  tubes.  No additional imaging, medical or surgical interventions. Palliative care and spiritual care consulted Appreciate neurosurgical consult   Admission status: Inpatient IV Fluids: Hep-Lock Diet/Nutrition: N.p.o. Consults called: Palliative care DVT Px: None Code Status: DNR/DNI Disposition Plan: Anticipate hospital death  All the records are reviewed and case discussed with ED provider. Management plans discussed with the patient's family who express understanding and agree with plan of care.   Review of Systems:  Unable to obtain secondary to unresponsiveness   Past Medical History:  Diagnosis Date   Asthma    childhood   Asthma    childhood   Chicken pox    Chronic back pain    Said spontaneous thoracic and lumbar spine fractures secondary to falls.   Colon polyps    Coronary artery disease involving native coronary artery without angina pectoris 07/2016   In setting of inferior STEMI, cardiac cath showed occluded RCA.  Too small for PCI   Coronary artery disease involving native heart without angina pectoris 07/31/2016   Dysuria 09/18/2021   Essential hypertension    Glaucoma    H/O traumatic subdural hematoma 03/2016   She fell while in [place]  visiting.  Injured her face. ->  Both aspirin and Plavix were stopped. 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 (no stents)    History of subdural hematoma 07/11/2021   History of vertebral fracture 07/11/2021   Hyperinflation of lungs 07/03/2020   Hyperlipidemia with target  LDL less than 70    On rosuvastatin    Hyperlipidemia with target LDL less than 70    On rosuvastatin    Hypothyroidism (acquired)    On Synthroid    Intracranial hemorrhage (HCC)    s/p fall years ago when on ? plavix so this was  stopped    Multiple fracture    x 5 vertebrae    Preoperative evaluation to rule out surgical contraindication 07/11/2021   RUQ abdominal pain 01/30/2019   ST elevation myocardial infarction (STEMI) of inferior wall (HCC) 07/11/2021   Urinary incontinence    UTI (urinary tract infection)     Past Surgical History:  Procedure Laterality Date   ABDOMINAL HYSTERECTOMY     2/2 endometriosis and DUB   BLADDER REPAIR     cataract surgery     b/l   CESAREAN SECTION     COLONOSCOPY WITH PROPOFOL  N/A 12/10/2019   Procedure: COLONOSCOPY WITH PROPOFOL ;  Surgeon: Luke Salaam, MD;  Location: [facility] ENDOSCOPY;  Service: Gastroenterology;  Laterality: N/A;   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 Cathlean Co in [facility]   CORNEAL TRANSPLANT     08/01/21 this is 3rd eye surgery over 5 years as of 08/21/21   CRANIOTOMY     [facility] evaccuation   RIGHT AND LEFT HEART CATH  07/2016   Inferior STEMI.  Culprit lesion mRPDA = was too small to address.  20% pLAD.  LCx-LPL normal.  Patent.  RCA ostial 30% RCA dominant.  Hazy plaque rupture and a small 1.5 mm PDA. --> LV Gram EF 50%, Inf HK. Increased LVEDP   TONSILLECTOMY     TRANSTHORACIC ECHOCARDIOGRAM  11/2015   Right is being within normal limits.     reports that she has never smoked. She has never been exposed to tobacco smoke. She has never used smokeless tobacco. She reports that she does not drink alcohol and does not use drugs.  Allergies  Allergen Reactions   Chocolate     H/a    Cocoa Nausea And Vomiting    H/a   Lactose Diarrhea   Lactose Intolerance (Gi)     Family History  Problem Relation Age of Onset   Other Mother        Old age   Hearing loss Mother    Cancer Father        colon died in 52   CVA Father    Lupus Sister    Lung cancer Brother    Cancer Brother        lung smoker   Heart disease Maternal Grandmother    Dementia Sister    Colon cancer Brother    Cancer Brother        colon   Cancer Son         testicular cancer dx age 32 now 69 as of 12/2018    Cancer Sister        skin   Breast cancer Neg Hx     Prior to Admission medications   Medication Sig Start Date End Date Taking? Authorizing Provider  aspirin 81 MG chewable tablet Chew by mouth daily.    [provider]  carvedilol  (COREG ) 12.5 MG tablet Take 1 tablet (12.5 mg total) by mouth 2 (two) times daily with a meal. 09/04/22 08/30/23  Valli Gaw, MD  levothyroxine  (SYNTHROID ) 88 MCG tablet Take 1 tablet (88 mcg total) by mouth daily before breakfast. 09/04/22   Valli Gaw, MD  moxifloxacin (  VIGAMOX) 0.5 % ophthalmic solution Place 1 drop into the left eye in the morning, at noon, in the evening, and at bedtime.    [provider]  prednisoLONE acetate (PRED FORTE) 1 % ophthalmic suspension 1 Drop(s) Left Eye 4 Times Daily 07/27/21   [provider]  rosuvastatin  (CRESTOR ) 10 MG tablet Take 1 tablet (10 mg total) by mouth daily. 05/30/23 05/24/24  Valli Gaw, MD    Physical Exam: Vitals:   08/14/23 1302 08/14/23 1344 08/14/23 1400  BP:   116/83  Pulse:   90  Resp:   (!) 39  Temp:   (!) 97.5 F (36.4 C)  SpO2: (!) 70% (!) 88% 90%    GENERAL: 83 y.o.-year-old white female patient, well-developed, well-nourished lying in the bed in no acute distress.  Agonal respirations. HEENT: Head atraumatic, normocephalic.  Unable to assess left pupil, pinpoint right pupil Neuro: Patient is unresponsive to verbal tactile or painful stimuli    Labs on Admission:  CBC: Recent Labs  Lab 08/14/23 1307  WBC 21.3*  HGB 16.7*  HCT 50.3*  MCV 95.3  PLT 224   Basic Metabolic Panel: Recent Labs  Lab 08/14/23 1307  NA 133*  K 3.8  CL 93*  CO2 21*  GLUCOSE 189*  BUN 31*  CREATININE 1.63*  CALCIUM  9.3   GFR: CrCl cannot be calculated (Unknown ideal weight.). Liver Function Tests: Recent Labs  Lab 08/14/23 1307  AST 70*  ALT 24  ALKPHOS 60  BILITOT 1.1  PROT 7.7  ALBUMIN 3.6   Recent  Labs  Lab 08/14/23 1307  LIPASE 23   No results for input(s): "AMMONIA" in the last 168 hours. Coagulation Profile: No results for input(s): "INR", "PROTIME" in the last 168 hours. Cardiac Enzymes: No results for input(s): "CKTOTAL", "CKMB", "CKMBINDEX", "TROPONINI" in the last 168 hours. BNP (last 3 results) No results for input(s): "PROBNP" in the last 8760 hours. HbA1C: No results for input(s): "HGBA1C" in the last 72 hours. CBG: No results for input(s): "GLUCAP" in the last 168 hours. Lipid Profile: No results for input(s): "CHOL", "HDL", "LDLCALC", "TRIG", "CHOLHDL", "LDLDIRECT" in the last 72 hours. Thyroid  Function Tests: No results for input(s): "TSH", "T4TOTAL", "FREET4", "T3FREE", "THYROIDAB" in the last 72 hours. Anemia Panel: No results for input(s): "VITAMINB12", "FOLATE", "FERRITIN", "TIBC", "IRON", "RETICCTPCT" in the last 72 hours. Urine analysis:    Component Value Date/Time   COLORURINE YELLOW 01/16/2022 0936   APPEARANCEUR CLEAR 01/16/2022 0936   APPEARANCEUR Clear 06/16/2020 0924   LABSPEC 1.015 01/16/2022 0936   PHURINE 6.0 01/16/2022 0936   GLUCOSEU NEGATIVE 01/16/2022 0936   GLUCOSEU NEGATIVE 09/28/2021 1028   HGBUR TRACE (A) 01/16/2022 0936   BILIRUBINUR NEGATIVE 01/16/2022 0936   BILIRUBINUR negtaive 09/18/2021 1120   BILIRUBINUR Negative 06/16/2020 0924   KETONESUR NEGATIVE 01/16/2022 0936   PROTEINUR TRACE (A) 01/16/2022 0936   UROBILINOGEN 0.2 09/28/2021 1028   NITRITE NEGATIVE 01/16/2022 0936   LEUKOCYTESUR TRACE (A) 01/16/2022 0936   Sepsis Labs: @LABRCNTIP (procalcitonin:4,lacticidven:4) )No results found for this or any previous visit (from the past 240 hours).   Radiological Exams on Admission: No results found.     Avanna Sowder D.O. on 08/14/2023 at 3:10 PM CC: Primary care physician; Valli Gaw, MD   08/14/2023, 3:10 PM

## 2023-08-14 NOTE — ED Notes (Addendum)
 Palliative care in with pt and family.

## 2023-08-14 NOTE — Consult Note (Cosign Needed Addendum)
 Consultation Note Date: 08/14/2023   Patient Name: Amy Montes  DOB: 08/19/1940  MRN: [id number]  Age / Sex: 83 y.o., female  PCP: Valli Gaw, MD Referring Physician: Gillie Lacy, DO  Reason for Consultation: Establishing goals of care  HPI/Patient Profile: PER H&P: "Amy Montes is a 83 y.o. female EMS reports family found the patient just shortly ago unresponsive in her kitchen.  There was notable dried emesis urine and they suspect family last talked to her a couple days ago     Patient's son and daughter-in-law reports she was fine when they saw her at church, they texted on Monday.  They had not heard from her for a day so son went to the house to check on her and found her unresponsive".  Clinical Assessment and Goals of Care: Notes and labs reviewed.  In to see patient with pharmacy Eryn. she is currently resting in bed with numerous family members at bedside.  Patient's daughter-in-law [name] states that she is patient's healthcare power of attorney; other family members who are present including patient's son and daughter agree with this.  They discussed that patient had been otherwise normal with no real health issues, and was found down this morning.  They discussed the information that they have been given and are working to wrap their heads around her extremely poor prognosis.  Family discusses that she has already been shifted to comfort care and want to ensure that she is comfortable throughout the dying process.  Family advises the patient is of strong faith.  Daughter-in-law who is a POA stepped out to ask questions regarding funeral arrangements and other expectations.  Questions were answered as able.    Discussed hospital death. Medications ordered for comfort. Epic chat sent to attending regarding updates; query initiation of seizure prophylaxis?   I completed a MOST form  today with person family confirms his H POA who is patient's daughter-in-law Daritza Brees and the signed original was placed in the chart. Each section of options on the form were reviewed in full detail and any questions were answered as needed. The form was scanned and sent to medical records for it to be uploaded under ACP tab in Epic. A photocopy was also placed in the chart to be scanned into EMR. The patient outlined their wishes for the following treatment decisions:  Cardiopulmonary Resuscitation: Do Not Attempt Resuscitation (DNR/No CPR)  Medical Interventions: Comfort Measures: Keep clean, warm, and dry. Use medication by any route, positioning, wound care, and other measures to relieve pain and suffering. Use oxygen, suction and manual treatment of airway obstruction as needed for comfort. Do not transfer to the hospital unless comfort needs cannot be met in current location.  Antibiotics: No antibiotics (use other measures to relieve symptoms)  IV Fluids: No IV fluids (provide other measures to ensure comfort)  Feeding Tube: No feeding tube       SUMMARY OF RECOMMENDATIONS   Patient has been shifted to comfort care by attending service; anticipate hospital death.  Medications ordered for comfort.  If patient has seizure activity, would consider initiating an Ativan infusion.    Prognosis:  Hours - Days      Primary Diagnoses: Present on Admission: **None**   I have reviewed the medical record, interviewed the patient and family, and examined the patient. The following aspects are pertinent.  Past Medical History:  Diagnosis Date   Asthma    childhood   Asthma    childhood   Chicken pox    Chronic back pain    Said spontaneous thoracic and lumbar spine fractures secondary to falls.   Colon polyps    Coronary artery disease involving native coronary artery without angina pectoris 07/2016   In setting of inferior STEMI, cardiac cath showed occluded RCA.  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  Injured her face. ->  Both aspirin and Plavix were stopped. 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 (no stents)    History of subdural hematoma 07/11/2021   History of vertebral fracture 07/11/2021   Hyperinflation of lungs 07/03/2020   Hyperlipidemia with target LDL less than 70    On rosuvastatin    Hyperlipidemia with target LDL less than 70    On rosuvastatin    Hypothyroidism (acquired)    On Synthroid    Intracranial hemorrhage (HCC)    s/p fall years ago when on ? plavix so this was stopped    Multiple fracture    x 5 vertebrae    Preoperative evaluation to rule out surgical contraindication 07/11/2021   RUQ abdominal pain 01/30/2019   ST elevation myocardial infarction (STEMI) of inferior wall (HCC) 07/11/2021   Urinary incontinence    UTI (urinary tract infection)    Social History   Socioeconomic History   Marital status: Single    Spouse name: Not on file   Number of children: 2   Years of education: Not on file   Highest education level: High school graduate  Occupational History   Occupation: Nanny  Tobacco Use   Smoking status: Never    Passive exposure: Never   Smokeless tobacco: Never  Vaping Use   Vaping status: Never Used  Substance and Sexual Activity   Alcohol use: Never   Drug use: Never   Sexual activity: Not Currently  Other Topics Concern   Not on file  Social History Narrative   Recently moved from [place], [place]  (near [place]  Now lives in [place], [place]   Lives alone, but very close to her  son Amy Montes and his wife.   Her daughter lives in [place], [place] 1 biological    Has adopted kids x 2    She has 3 grandchildren.   widow      She drinks coffee.     She does water aerobics/pool exercises.      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  Prior to moving to [place], she was the primary caregiver for her brother who passed away in 2017-08-29.  Once he died, her children convinced  her to move up to [place] to be near them.      6 other siblings 2 1/2 siblings but those 1/2 siblings died in young age   Social Drivers of Health   Financial Resource Strain: Low Risk  (08/21/2022)   Overall Financial Resource Strain (CARDIA)    Difficulty of Paying Living Expenses: Not hard at all  Food Insecurity: No Food Insecurity (08/21/2022)   Hunger Vital Sign    Worried About Running Out of Food in the Last Year: Never true    Ran Out of Food in the Last Year: Never true  Transportation Needs: No Transportation Needs (08/21/2022)   PRAPARE - Administrator, Civil Service (Medical): No    Lack of Transportation (Non-Medical): No  Physical Activity: Insufficiently Active (08/21/2022)   Exercise Vital Sign    Days of Exercise per Week: 2 days    Minutes of Exercise per Session: 20 min  Stress: No Stress Concern Present (08/21/2022)   [employer] of Occupational Health - Occupational Stress Questionnaire    Feeling of Stress : Not at all  Social Connections: Moderately Isolated (08/21/2022)   Social Connection and Isolation Panel [NHANES]    Frequency of Communication with Friends and Family: More than three times a week    Frequency of Social Gatherings with Friends and Family: More than three times a week    Attends Religious Services: More than 4 times per year    Active Member of [employer] or Organizations: No    Attends Banker Meetings: Never    Marital Status: Widowed   Family History  Problem Relation Age of Onset   Other Mother        Old age   Hearing loss Mother     Cancer Father        colon died in 09-17-1959   CVA Father    Lupus Sister    Lung cancer Brother    Cancer Brother        lung smoker   Heart disease Maternal Grandmother    Dementia Sister    Colon cancer Brother    Cancer Brother        colon   Cancer Son        testicular cancer dx age 64 now 61 as of 12/2018    Cancer Sister        skin   Breast cancer Neg Hx    Scheduled Meds:  denosumab   60 mg Subcutaneous Once   Continuous Infusions:  sodium chloride      PRN Meds:. Medications Prior to Admission:  Prior to Admission medications   Medication Sig Start Date End Date Taking? Authorizing Provider  aspirin 81 MG chewable tablet Chew by mouth daily.   Yes [provider]  carvedilol  (COREG ) 12.5 MG tablet Take 1 tablet (12.5 mg total) by mouth 2 (two) times daily with a meal. 09/04/22 08/30/23 Yes Valli Gaw, MD  levothyroxine  (SYNTHROID ) 88 MCG tablet Take 1 tablet (88 mcg total) by mouth daily before breakfast. 09/04/22  Yes Valli Gaw, MD  moxifloxacin (VIGAMOX) 0.5 % ophthalmic solution Place 1 drop into the left eye in the morning, at noon, in the evening, and at bedtime.   Yes [provider]  prednisoLONE acetate (PRED FORTE) 1 % ophthalmic suspension 1 Drop(s) Left Eye 4 Times Daily 07/27/21  Yes [provider]  rosuvastatin  (CRESTOR ) 10 MG tablet Take 1 tablet (10 mg total) by mouth daily. 05/30/23 05/24/24 Yes  Valli Gaw, MD   Allergies  Allergen Reactions   Chocolate     H/a    Cocoa Nausea And Vomiting    H/a   Lactose Diarrhea   Lactose Intolerance (Gi)    Review of Systems  Unable to perform ROS   Physical Exam Constitutional:      Comments: Eyes closed.  Unresponsive.  Pulmonary:     Effort: Pulmonary effort is normal.     Comments: Nonrebreather in place. Skin:    General: Skin is warm and dry.     Vital Signs: BP (!) 130/56   Pulse (!) 30   Temp (!) 97.3 F (36.3 C)   Resp (!) 26   SpO2 98%  Pain Scale: Not given  for pain   Pain Score: 0-No pain   SpO2: SpO2: 98 % O2 Device:SpO2: 98 % O2 Flow Rate: .   IO: Intake/output summary: No intake or output data in the 24 hours ending 08/14/23 1719  LBM:   Baseline Weight:   Most recent weight:        Signed by: Meribeth Standard, NP   Please contact Palliative Medicine Team phone at [phone] for questions and concerns.  For individual provider: See Tilford Foley

## 2023-08-14 NOTE — ED Notes (Signed)
 Family at bedside   pt on 100% nonrebreather.

## 2023-08-14 NOTE — ED Notes (Signed)
 Pt moved to cpod 38.  Report off to [facility].

## 2023-08-14 NOTE — Progress Notes (Signed)
  Chaplain On-Call responded to a page regarding an unresponsive patient in ED-2.  Chaplain met Dr. Lenora Radish, who provided an update and introduced me to patient's family.  The family stated that their Geralynn Knife is expected to arrive soon, and they declined Chaplain services.  Chaplain Dean Every., [facility]

## 2023-08-14 NOTE — ED Notes (Signed)
 EDP at bedside

## 2023-08-15 MED ORDER — GLYCOPYRROLATE 0.2 MG/ML IJ SOLN: 0.2000 mg | INTRAMUSCULAR | Status: DC | PRN

## 2023-08-15 MED ORDER — GLYCOPYRROLATE 1 MG PO TABS: 1.0000 mg | ORAL_TABLET | ORAL | Status: DC | PRN

## 2023-08-15 MED ORDER — LORAZEPAM 2 MG/ML PO CONC: 0.5000 mg | ORAL | Status: DC | PRN

## 2023-08-15 MED ORDER — HALOPERIDOL LACTATE 5 MG/ML IJ SOLN: 2.5000 mg | INTRAMUSCULAR | Status: DC | PRN

## 2023-08-15 MED ORDER — POLYVINYL ALCOHOL 1.4 % OP SOLN: 1.0000 [drp] | Freq: Four times a day (QID) | OPHTHALMIC | Status: DC | PRN

## 2023-08-15 MED ORDER — DIPHENHYDRAMINE HCL 50 MG/ML IJ SOLN: 25.0000 mg | INTRAMUSCULAR | Status: DC | PRN

## 2023-08-19 LAB — BLOOD GAS, VENOUS
Bicarbonate: 32.2 mmol/L — ABNORMAL HIGH (ref 20.0–28.0)
FIO2: 100 %
O2 Saturation: 33.9 mmol/L — ABNORMAL HIGH (ref 0.0–2.0)
Patient temperature: 37
Patient temperature: 37 %
pCO2, Ven: 67 mmHg — ABNORMAL HIGH (ref 44–60)
pH, Ven: 7.29 (ref 7.25–7.43)
pO2, Ven: <32.2 mmol/L — CL (ref 32–45)

## 2023-08-25 NOTE — Plan of Care (Deleted)
  Problem: Education: Goal: Knowledge of the prescribed therapeutic regimen will improve Outcome: Progressing   Problem: Coping: Goal: Ability to identify and develop effective coping behavior will improve Outcome: Progressing   Problem: Clinical Measurements: Goal: Quality of life will improve Outcome: Progressing   Problem: Respiratory: Goal: Verbalizations of increased ease of respirations will increase Outcome: Progressing   Problem: Role Relationship: Goal: Family's ability to cope with current situation will improve Outcome: Progressing Goal: Ability to verbalize concerns, feelings, and thoughts to partner or family member will improve Outcome: Progressing   Problem: Pain Management: Goal: Satisfaction with pain management regimen will improve Outcome: Progressing   Problem: Health Behavior/Discharge Planning: Goal: Ability to manage health-related needs will improve Outcome: Progressing   Problem: Education: Goal: Knowledge of General Education information will improve Description: Including pain rating scale, medication(s)/side effects and non-pharmacologic comfort measures Outcome: Progressing   Problem: Clinical Measurements: Goal: Ability to maintain clinical measurements within normal limits will improve Outcome: Progressing Goal: Will remain free from infection Outcome: Progressing Goal: Diagnostic test results will improve Outcome: Progressing Goal: Respiratory complications will improve Outcome: Progressing Goal: Cardiovascular complication will be avoided Outcome: Progressing   Problem: Activity: Goal: Risk for activity intolerance will decrease Outcome: Progressing

## 2023-08-25 NOTE — Progress Notes (Signed)
 Patient's time of death was pronounced by this RN-Natalynn Pedone Garlin Junker and Inell Mandril at [phone] on 08/07/2023. Patient's son Alexandre Lightsey, and [place] Alexxia Stankiewicz at bedside at time of death. Provider Notified.

## 2023-08-25 DEATH — deceased

## 2023-09-16 ENCOUNTER — Ambulatory Visit: Admitting: Family Medicine

## 2023-09-16 ENCOUNTER — Other Ambulatory Visit

## 2024-01-25 NOTE — Discharge Summary (Signed)
    DEATH SUMMARY   Patient Details  Name: AILED DEFIBAUGH MRN: [id number] DOB: 12/31/1940 ERE:Tjody, Glenys, MD (Inactive)  Admission/Discharge Information   Admit Date:  2023/08/23  Date of Death: Date of Death: 24-Aug-2023  Time of Death: Time of Death: 0252  Length of Stay: 1   Principle Cause of death: Intracranial hemorrhage  Hospital Diagnoses: Principal Problem:   Intracranial hemorrhage (HCC) Active Problems:   Intracranial hematoma [facility])   Hospital Course: Kameisha Malicki is a 83 y.o. female with a known history of HLD, HTN, CAD, hypothyroidism, GERD, osteoporosis presents to the emergency department for evaluation of unresponsiveness.  Patient was last seen in normal state of health at 11AM yesterday, family went to check on her and found her unresponsive with vomitus on her.   Family denies any report of symptoms such as headache chest pain shortness of breath dizziness or lightheadedness.  She lives independently   Emergency department evaluation revealed massive right sided intracranial hemorrhage with midline shift.  Neurosurgery was consulted.  Patient's daughter-in-law is her healthcare power of attorney and expresses that the patient's wishes are not to have any heroic measures.  The family is in agreement that they would like to keep her comfortable.    Assessment and Plan: Patient expired at 0252 on 08/24/23, pronounced by RN-Kendall Rubbie and Ronal Dustman Patient's son Jarae Nemmers, and POA Betsi Crespi at bedside at time of death    Condition at discharge: expired      Procedures: None  Consultations: None  The results of significant diagnostics from this hospitalization (including imaging, microbiology, ancillary and laboratory) are listed below for reference.   Significant Diagnostic Studies: No results found.  Microbiology: No results found for this or any previous visit (from the past 240 hours).  Time spent: <30 minutes  Signed: [employer], DO Aug 24, 2023
